# Patient Record
Sex: Female | Born: 1985 | Race: White | Hispanic: No | Marital: Married | Smoking: Never smoker
Health system: Southern US, Community
[De-identification: ages and names within clinical notes are randomized; demographics above are authoritative.]

## PROBLEM LIST (undated history)

## (undated) ENCOUNTER — Inpatient Hospital Stay (HOSPITAL_COMMUNITY): Payer: Self-pay

## (undated) DIAGNOSIS — O039 Complete or unspecified spontaneous abortion without complication: Secondary | ICD-10-CM

## (undated) DIAGNOSIS — O009 Unspecified ectopic pregnancy without intrauterine pregnancy: Secondary | ICD-10-CM

## (undated) DIAGNOSIS — G971 Other reaction to spinal and lumbar puncture: Secondary | ICD-10-CM

## (undated) DIAGNOSIS — I499 Cardiac arrhythmia, unspecified: Secondary | ICD-10-CM

## (undated) HISTORY — DX: Cardiac arrhythmia, unspecified: I49.9

## (undated) HISTORY — PX: NO PAST SURGERIES: SHX2092

---

## 2007-10-22 DIAGNOSIS — I499 Cardiac arrhythmia, unspecified: Secondary | ICD-10-CM

## 2007-10-22 HISTORY — DX: Cardiac arrhythmia, unspecified: I49.9

## 2011-07-16 ENCOUNTER — Inpatient Hospital Stay (HOSPITAL_COMMUNITY)
Admission: AD | Admit: 2011-07-16 | Discharge: 2011-07-16 | Disposition: A | Discharge status: Home / Self Care | Payer: Self-pay | Source: Ambulatory Visit | Attending: Obstetrics & Gynecology | Admitting: Obstetrics & Gynecology

## 2011-07-16 ENCOUNTER — Encounter (HOSPITAL_COMMUNITY): Payer: Self-pay | Admitting: *Deleted

## 2011-07-16 DIAGNOSIS — N949 Unspecified condition associated with female genital organs and menstrual cycle: Secondary | ICD-10-CM

## 2011-07-16 DIAGNOSIS — N938 Other specified abnormal uterine and vaginal bleeding: Secondary | ICD-10-CM | POA: Insufficient documentation

## 2011-07-16 LAB — URINALYSIS, ROUTINE W REFLEX MICROSCOPIC
Leukocytes, UA: NEGATIVE
Nitrite: NEGATIVE
Specific Gravity, Urine: >1.03 — ABNORMAL HIGH (ref 1.005–1.030)
Urobilinogen, UA: 0.2 mg/dL (ref 0.0–1.0)
pH: 6.5 (ref 5.0–8.0)

## 2011-07-16 LAB — POCT PREGNANCY, URINE: Preg Test, Ur: NEGATIVE

## 2011-07-16 LAB — URINE MICROSCOPIC-ADD ON

## 2011-07-16 LAB — WET PREP, GENITAL
Clue Cells Wet Prep HPF POC: NONE SEEN
Trich, Wet Prep: NONE SEEN

## 2011-07-16 NOTE — Progress Notes (Signed)
Pt presents to [facility] for brown discharge. Started yesterday. Has been having some mild cramping with that.

## 2011-07-16 NOTE — ED Provider Notes (Signed)
Attestation of Attending Supervision of Advanced Practitioner: Evaluation and management procedures were performed by the PA/NP/CNM/OB Fellow under my supervision/collaboration. Chart reviewed and agree with management and plan.  Albert Hersch A 07/16/2011 7:58 PM   

## 2011-07-16 NOTE — Progress Notes (Signed)
Pt reports she has had an IUD for 3 yrs and doesn't have periods but for the last 2 days she has had a brownish discharge and cramping x 2 days. Denies dysuria.

## 2011-07-16 NOTE — Progress Notes (Signed)
E. Rice, PA at bedside.  Assessment done and poc discussed with pt.   

## 2011-07-16 NOTE — Progress Notes (Signed)
SSE done per PA, wet prep and cultures collected. VE done.

## 2011-07-16 NOTE — ED Provider Notes (Signed)
Attestation of Attending Supervision of Advanced Practitioner: Evaluation and management procedures were performed by the PA/NP/CNM/OB Fellow under my supervision/collaboration. Chart reviewed and agree with management and plan.  Lanae Federer A 07/16/2011 9:06 PM

## 2011-07-16 NOTE — ED Provider Notes (Addendum)
History   Pt presents today c/o vag dc and spotting for the past 2 days. She states she had an IUD placed 3 years ago and has not had menses since that time. She also c/o mild lower abd cramping. She denies vag irritation, fever, or any other sx at this time. She reports her last episode of intercourse 3-4 days ago.  Chief Complaint  Patient presents with  . Abdominal Pain   HPI  OB History    Grav Para Term Preterm Abortions TAB SAB Ect Mult Living   2 2 1 1  0 0 0 0 0 2      Past Medical History  Diagnosis Date  . No pertinent past medical history     Past Surgical History  Procedure Date  . No past surgeries     No family history on file.  History  Substance Use Topics  . Smoking status: Never Smoker   . Smokeless tobacco: Not on file  . Alcohol Use: No    Allergies: No Known Allergies  No prescriptions prior to admission    Review of Systems  Constitutional: Negative for fever.  Cardiovascular: Negative for chest pain.  Gastrointestinal: Positive for abdominal pain. Negative for nausea, vomiting, diarrhea and constipation.  Genitourinary: Negative for dysuria, urgency, frequency and hematuria.  Neurological: Negative for dizziness and headaches.  Psychiatric/Behavioral: Negative for depression and suicidal ideas.   Physical Exam   Blood pressure 112/71, pulse 72, temperature 97 F (36.1 C), resp. rate 16, height 5\' 3"  (1.6 m), weight 122 lb (55.339 kg).  Physical Exam  Constitutional: She is oriented to person, place, and time. She appears well-developed and well-nourished. No distress.  HENT:  Head: Normocephalic and atraumatic.  Eyes: EOM are normal. Pupils are equal, round, and reactive to light.  GI: Soft. She exhibits no distension. There is no tenderness. There is no rebound and no guarding.  Genitourinary: There is bleeding around the vagina. Vaginal discharge found.       IUD strings intact. Uterus NL size and shape. No adnexal masses.    Neurological: She is alert and oriented to person, place, and time.  Skin: Skin is warm and dry. She is not diaphoretic.  Psychiatric: She has a normal mood and affect. Her behavior is normal. Judgment and thought content normal.    MAU Course  Procedures    Assessment and Plan  Care of pt turned over to [facility], CNM.  Clinton Gallant. Rice III, DrHSc, MPAS, PA-C  07/16/2011, 7:45 PM   Henrietta Hoover, PA 07/16/11 1957   Wet Preg: Neg for infection UA: Neg for infection UPT: neg  Assessment: DUB r/t IUD vs. Menstruation  Expectant Management FU with PCP: [facility] if spotting persists more than 10 days.  Sherryann Frese E. 07/16/2011 8:38 PM

## 2012-06-09 ENCOUNTER — Encounter (HOSPITAL_COMMUNITY): Payer: Self-pay

## 2012-06-09 ENCOUNTER — Inpatient Hospital Stay (HOSPITAL_COMMUNITY): Payer: BC Managed Care – PPO

## 2012-06-09 ENCOUNTER — Inpatient Hospital Stay (HOSPITAL_COMMUNITY)
Admission: AD | Admit: 2012-06-09 | Discharge: 2012-06-09 | Disposition: A | Discharge status: Home / Self Care | Payer: BC Managed Care – PPO | Source: Ambulatory Visit | Attending: Obstetrics & Gynecology | Admitting: Obstetrics & Gynecology

## 2012-06-09 DIAGNOSIS — O99891 Other specified diseases and conditions complicating pregnancy: Secondary | ICD-10-CM | POA: Insufficient documentation

## 2012-06-09 DIAGNOSIS — O0910 Supervision of pregnancy with history of ectopic or molar pregnancy, unspecified trimester: Secondary | ICD-10-CM

## 2012-06-09 LAB — CBC
Hemoglobin: 14.5 g/dL (ref 12.0–15.0)
MCH: 31 pg (ref 26.0–34.0)
MCHC: 34.9 g/dL (ref 30.0–36.0)
Platelets: 278 10*3/uL (ref 150–400)
RBC: 4.67 MIL/uL (ref 3.87–5.11)

## 2012-06-09 LAB — HCG, QUANTITATIVE, PREGNANCY: hCG, Beta Chain, Quant, S: 6173 m[IU]/mL — ABNORMAL HIGH (ref <5)

## 2012-06-09 LAB — ABO/RH: ABO/RH(D): O POS

## 2012-06-09 NOTE — MAU Note (Signed)
Patient states she had an ectopic with MTX in April in [facility]. Patient states she is pregnant again and has seen at [facility] and had Had a BHCG on 8-19 that was 5000 and an ultrasound(prior to [facility] results) with thickening of the lining of the uterus. Was instructed by her MD to return tomorrow for MTX. Patient is concerned and would like a second opinion. Patient denies any pain or bleeding.

## 2012-06-09 NOTE — MAU Provider Note (Signed)
Attestation of Attending Supervision of Advanced Practitioner (CNM/NP): Evaluation and management procedures were performed by the Advanced Practitioner under my supervision and collaboration.  I have reviewed the Advanced Practitioner's note and chart, and I agree with the management and plan.  Marchetta Navratil, MD, FACOG Attending Obstetrician & Gynecologist Faculty Practice, [facility] [place]  

## 2012-06-09 NOTE — MAU Provider Note (Signed)
History     CSN: [id number]  Arrival date and time: 06/09/12 1404   None     No chief complaint on file.  HPI 26 y.o. [id number] at [redacted]w[redacted]d. Seen by MD in [facility] yesterday with + UPT, pt states they did transvaginal u/s and could not visualize IUP, but states that her "endometrium was thickened". States today she received a call that her quant HCG was 5000 and was instructed to return for repeat u/s and possible MTX treatment for ectopic pregnancy. Has a history of ectopic in March of this year that was resolved with MTX. Pt denies pain or bleeding today, requesting evaluation for ectopic/second opinion.    Past Medical History  Diagnosis Date  . No pertinent past medical history     Past Surgical History  Procedure Date  . No past surgeries     No family history on file.  History  Substance Use Topics  . Smoking status: Never Smoker   . Smokeless tobacco: Not on file  . Alcohol Use: No    Allergies: No Known Allergies  No prescriptions prior to admission    Review of Systems  Constitutional: Negative.   Respiratory: Negative.   Cardiovascular: Negative.   Gastrointestinal: Negative for nausea, vomiting, abdominal pain, diarrhea and constipation.  Genitourinary: Negative for dysuria, urgency, frequency, hematuria and flank pain.       Negative for vaginal bleeding, vaginal discharge, dyspareunia  Musculoskeletal: Negative.   Neurological: Negative.   Psychiatric/Behavioral: Negative.    Physical Exam   Blood pressure 113/91, pulse 100, temperature 99.1 F (37.3 C), resp. rate 16, height 5' 2.5" (1.588 m), weight 125 lb (56.7 kg), last menstrual period 05/07/2012, SpO2 100.00%.  Physical Exam  Nursing note and vitals reviewed. Constitutional: She is oriented to person, place, and time. She appears well-developed and well-nourished. No distress.  Cardiovascular: Normal rate.   Respiratory: Effort normal.  Musculoskeletal: Normal range of motion.    Neurological: She is alert and oriented to person, place, and time.  Skin: Skin is warm and dry.  Psychiatric: She has a normal mood and affect.    MAU Course  Procedures  Results for orders placed during the hospital encounter of 06/09/12 (from the past 24 hour(s))  CBC     Status: Normal   Collection Time   06/09/12  2:40 PM      Component Value Range   WBC 8.2  4.0 - 10.5 K/uL   RBC 4.67  3.87 - 5.11 MIL/uL   Hemoglobin 14.5  12.0 - 15.0 g/dL   HCT 14.7  82.9 - 56.2 %   MCV 89.1  78.0 - 100.0 fL   MCH 31.0  26.0 - 34.0 pg   MCHC 34.9  30.0 - 36.0 g/dL   RDW 13.0  86.5 - 78.4 %   Platelets 278  150 - 400 K/uL  ABO/RH     Status: Normal (Preliminary result)   Collection Time   06/09/12  2:40 PM      Component Value Range   ABO/RH(D) O POS    HCG, QUANTITATIVE, PREGNANCY     Status: Abnormal   Collection Time   06/09/12  2:40 PM      Component Value Range   hCG, Beta Chain, Quant, S 6173 (*) <5 mIU/mL   U/S: IUGS, no yolk sac or fetal pole  Assessment and Plan  25 y.o. [id number] at [redacted]w[redacted]d F/U in 48 hours for repeat quant Precautions rev'd  Kayde Warehime 06/09/2012, 3:07 PM

## 2012-06-12 ENCOUNTER — Inpatient Hospital Stay (HOSPITAL_COMMUNITY)
Admission: AD | Admit: 2012-06-12 | Discharge: 2012-06-12 | Disposition: A | Discharge status: Home / Self Care | Payer: BC Managed Care – PPO | Source: Ambulatory Visit | Attending: Obstetrics & Gynecology | Admitting: Obstetrics & Gynecology

## 2012-06-12 DIAGNOSIS — Z3201 Encounter for pregnancy test, result positive: Secondary | ICD-10-CM

## 2012-06-12 DIAGNOSIS — O99891 Other specified diseases and conditions complicating pregnancy: Secondary | ICD-10-CM | POA: Insufficient documentation

## 2012-06-12 NOTE — MAU Provider Note (Signed)
History   Chief Complaint:  Follow-up   Jamie Wright is  26 y.o. [id number] Patient's last menstrual period was 05/07/2012.Jamie Wright Patient is here for follow up of quantitative HCG and ongoing surveillance of pregnancy status.   She is [redacted]w[redacted]d weeks gestation  by LMP.    Since her last visit, the patient is without new complaint.   The patient reports bleeding as  none now.    General ROS:  negative  Her previous Quantitative HCG values are: 8/20: 6173    Physical Exam   Blood pressure 106/67, pulse 71, temperature 97.5 F (36.4 C), temperature source Oral, resp. rate 18, last menstrual period 05/07/2012.  Focused Gynecological Exam: examination not indicated  Labs: Recent Results (from the past 24 hour(s))  HCG, QUANTITATIVE, PREGNANCY   Collection Time   06/12/12  8:46 AM      Component Value Range   hCG, Beta Chain, Quant, S 14616 (*) <5 mIU/mL    Ultrasound Studies:    Assessment: [redacted]w[redacted]d weeks by LMP gestation with appropriately rising quants   Plan: Discharge home FU in 1 week for repeat US  Courtland Reas E. 06/12/2012, 11:49 AM

## 2012-06-12 NOTE — MAU Provider Note (Signed)
Attestation of Attending Supervision of Advanced Practitioner (CNM/NP): Evaluation and management procedures were performed by the Advanced Practitioner under my supervision and collaboration.  I have reviewed the Advanced Practitioner's note and chart, and I agree with the management and plan.  HARRAWAY-SMITH, Sherral Dirocco 3:36 PM     

## 2012-06-12 NOTE — MAU Note (Signed)
Here for blood work.  States is feeling good, no pain or bleeding.

## 2012-06-17 ENCOUNTER — Encounter (HOSPITAL_COMMUNITY): Payer: Self-pay | Admitting: *Deleted

## 2012-06-17 ENCOUNTER — Inpatient Hospital Stay (HOSPITAL_COMMUNITY)
Admission: AD | Admit: 2012-06-17 | Discharge: 2012-06-17 | Disposition: A | Discharge status: Home / Self Care | Payer: BC Managed Care – PPO | Source: Ambulatory Visit | Attending: Obstetrics & Gynecology | Admitting: Obstetrics & Gynecology

## 2012-06-17 ENCOUNTER — Ambulatory Visit (HOSPITAL_COMMUNITY)
Admit: 2012-06-17 | Discharge: 2012-06-17 | Disposition: A | Discharge status: Home / Self Care | Payer: BC Managed Care – PPO | Attending: Physician Assistant | Admitting: Physician Assistant

## 2012-06-17 DIAGNOSIS — R109 Unspecified abdominal pain: Secondary | ICD-10-CM

## 2012-06-17 DIAGNOSIS — Z8751 Personal history of pre-term labor: Secondary | ICD-10-CM | POA: Insufficient documentation

## 2012-06-17 DIAGNOSIS — O210 Mild hyperemesis gravidarum: Secondary | ICD-10-CM | POA: Insufficient documentation

## 2012-06-17 DIAGNOSIS — Z3490 Encounter for supervision of normal pregnancy, unspecified, unspecified trimester: Secondary | ICD-10-CM

## 2012-06-17 DIAGNOSIS — Z3201 Encounter for pregnancy test, result positive: Secondary | ICD-10-CM

## 2012-06-17 DIAGNOSIS — O26899 Other specified pregnancy related conditions, unspecified trimester: Secondary | ICD-10-CM

## 2012-06-17 DIAGNOSIS — O3680X0 Pregnancy with inconclusive fetal viability, not applicable or unspecified: Secondary | ICD-10-CM | POA: Insufficient documentation

## 2012-06-17 HISTORY — DX: Unspecified ectopic pregnancy without intrauterine pregnancy: O00.90

## 2012-06-17 MED ORDER — ONDANSETRON 4 MG PO TBDP: 4.0000 mg | ORAL_TABLET | Freq: Four times a day (QID) | ORAL | Status: AC | PRN

## 2012-06-17 MED ORDER — PRENATAL PLUS 27-1 MG PO TABS: 1.0000 | ORAL_TABLET | Freq: Every day | ORAL | Status: DC

## 2012-06-17 NOTE — MAU Provider Note (Signed)
Chief Complaint: No chief complaint on file.   None    SUBJECTIVE HPI: Jamie Wright is a 26 y.o. [id number] at [redacted]w[redacted]d by LMP who presents after scheduled F/U US. Denies abd pain or vaginal bleeding. No concerns or medical problems.   Past Medical History  Diagnosis Date  . UTI (urinary tract infection)    OB History    Grav Para Term Preterm Abortions TAB SAB Ect Mult Living   4 2 1 1 1  0 0 1 0 2     # Outc Date GA Lbr Len/2nd Wgt Sex Del Anes PTL Lv   1 TRM            2 PRE            3 ECT            4 CUR              Past Surgical History  Procedure Date  . No past surgeries    History   Social History  . Marital Status: Married    Spouse Name: N/A    Number of Children: N/A  . Years of Education: N/A   Occupational History  . Not on file.   Social History Main Topics  . Smoking status: Former Games developer  . Smokeless tobacco: Not on file  . Alcohol Use: No  . Drug Use: No  . Sexually Active: Yes   Other Topics Concern  . Not on file   Social History Narrative  . No narrative on file   No current facility-administered medications on file prior to encounter.   No current outpatient prescriptions on file prior to encounter.   No Known Allergies  ROS: Pertinent items in HPI  OBJECTIVE Last menstrual period 05/07/2012. GENERAL: Well-developed, well-nourished female in no acute distress.  HEENT: Normocephalic HEART: normal rate RESP: normal effort ABDOMEN: Soft, non-tender EXTREMITIES: Nontender, no edema NEURO: Alert and oriented LAB RESULTS No results found for this or any previous visit (from the past 24 hour(s)).  IMAGING Viable IUP with FHR 101, GA by sac size [redacted]w[redacted]d; adnexae normal  ASSESSMENT 1. Abdominal pain complicating pregnancy   2. Viable Pregnancy   3. Pregnancy related nausea, antepartum     PLAN Discharge home Medication List  As of 06/17/2012  5:05 PM   TAKE these medications         ondansetron 4 MG disintegrating tablet   Commonly known as: ZOFRAN-ODT   Take 1 tablet (4 mg total) by mouth every 6 (six) hours as needed for nausea.      prenatal vitamin w/FE, FA 27-1 MG Tabs   Take 1 tablet by mouth daily.           Pregnancy precautions and list of providers given  Danae Orleans, CNM 06/17/2012  4:53 PM

## 2012-06-17 NOTE — MAU Note (Signed)
Here for viability [place].  No complaints of pain or bleeding.

## 2012-06-18 NOTE — MAU Provider Note (Signed)
Attestation of Attending Supervision of Advanced Practitioner (CNM/NP): Evaluation and management procedures were performed by the Advanced Practitioner under my supervision and collaboration.  I have reviewed the Advanced Practitioner's note and chart, and I agree with the management and plan.  Ja Ohman, MD, FACOG Attending Obstetrician & Gynecologist Faculty Practice, [facility]  

## 2012-07-02 ENCOUNTER — Telehealth: Payer: Self-pay | Admitting: Obstetrics and Gynecology

## 2012-07-02 ENCOUNTER — Inpatient Hospital Stay (HOSPITAL_COMMUNITY): Payer: BC Managed Care – PPO

## 2012-07-02 ENCOUNTER — Encounter (HOSPITAL_COMMUNITY): Payer: Self-pay | Admitting: *Deleted

## 2012-07-02 ENCOUNTER — Inpatient Hospital Stay (HOSPITAL_COMMUNITY)
Admission: AD | Admit: 2012-07-02 | Discharge: 2012-07-02 | Disposition: A | Discharge status: Home / Self Care | Payer: BC Managed Care – PPO | Source: Ambulatory Visit | Attending: Obstetrics and Gynecology | Admitting: Obstetrics and Gynecology

## 2012-07-02 DIAGNOSIS — O210 Mild hyperemesis gravidarum: Secondary | ICD-10-CM | POA: Insufficient documentation

## 2012-07-02 HISTORY — DX: Other reaction to spinal and lumbar puncture: G97.1

## 2012-07-02 LAB — URINALYSIS, ROUTINE W REFLEX MICROSCOPIC
Glucose, UA: NEGATIVE mg/dL
Hgb urine dipstick: NEGATIVE
Ketones, ur: NEGATIVE mg/dL
Protein, ur: NEGATIVE mg/dL
Urobilinogen, UA: 0.2 mg/dL (ref 0.0–1.0)

## 2012-07-02 LAB — COMPREHENSIVE METABOLIC PANEL
BUN: 8 mg/dL (ref 6–23)
CO2: 23 mEq/L (ref 19–32)
Calcium: 9.3 mg/dL (ref 8.4–10.5)
Chloride: 103 mEq/L (ref 96–112)
Creatinine, Ser: 0.69 mg/dL (ref 0.50–1.10)
GFR calc Af Amer: >90 mL/min (ref >90)
GFR calc non Af Amer: >90 mL/min (ref >90)
Glucose, Bld: 83 mg/dL (ref 70–99)
Total Bilirubin: 0.3 mg/dL (ref 0.3–1.2)

## 2012-07-02 LAB — CBC
HCT: 36.8 % (ref 36.0–46.0)
MCH: 31.1 pg (ref 26.0–34.0)
MCV: 90.2 fL (ref 78.0–100.0)
RBC: 4.08 MIL/uL (ref 3.87–5.11)
WBC: 6.8 10*3/uL (ref 4.0–10.5)

## 2012-07-02 MED ORDER — ONDANSETRON 4 MG PO TBDP: 4.0000 mg | ORAL_TABLET | Freq: Three times a day (TID) | ORAL | Status: AC | PRN

## 2012-07-02 MED ORDER — FAMOTIDINE IN NACL 20-0.9 MG/50ML-% IV SOLN
20.0000 mg | Freq: Once | INTRAVENOUS | Status: AC
  Administered 2012-07-02: 20 mg via INTRAVENOUS
  Filled 2012-07-02: qty 50

## 2012-07-02 MED ORDER — LACTATED RINGERS IV BOLUS (SEPSIS)
500.0000 mL | Freq: Once | INTRAVENOUS | Status: AC
  Administered 2012-07-02: 14:00:00 via INTRAVENOUS

## 2012-07-02 MED ORDER — ONDANSETRON HCL 4 MG/2ML IJ SOLN
4.0000 mg | Freq: Once | INTRAMUSCULAR | Status: AC
  Administered 2012-07-02: 4 mg via INTRAVENOUS
  Filled 2012-07-02: qty 2

## 2012-07-02 NOTE — Telephone Encounter (Signed)
JACKIE/REQUEST CALL

## 2012-07-02 NOTE — MAU Note (Signed)
Patient states she has had nausea and vomiting all week. Unable to keep almost anything down. Had a fall 2 days ago and having right side pain. No bleeding.

## 2012-07-02 NOTE — Telephone Encounter (Signed)
Spoke to pt who was initially c/o of just nausea and vomitting/ unable to keep anything down. She then went on to tell me that she had fallen down a step or two carrying laundry down the steps on Tues. This week. No bleeding, but pt c/o a lot of back pain and pelvic pain. Per Paulino Door, pt should present to MAU this am. Pt is agreeable and will  Go now. Melody Comas A

## 2012-07-02 NOTE — MAU Provider Note (Signed)
History    This patient had called the office today and stated that she has been nauseated for past few weeks and has been vomiting. GA [redacted] weeks from LMP and early USS at [facility]. Also had hx of all down 15 steps in past 4 days. Needs evaluation s/p fall. Presents with no headache or injury. The patient has been attending [facility] and transferred to [facility]. She has her OB interview tomorrow. Hx of Ectopic Pregnancy  April 2013 resolved with Methotrexate.  CSN: [id number]  Arrival date and time: 07/02/12 1105   None     Chief Complaint  Patient presents with  . Emesis During Pregnancy   HPI  OB History    Grav Para Term Preterm Abortions TAB SAB Ect Mult Living   4 2 1 1 1  0 0 1 0 2      Past Medical History  Diagnosis Date  . UTI (urinary tract infection)   . Preterm labor   . Ectopic pregnancy   . Spinal headache     ? bad migraines following delivery    Past Surgical History  Procedure Date  . No past surgeries     Family History  Problem Relation Age of Onset  . Other Neg Hx   . Hearing loss Neg Hx     History  Substance Use Topics  . Smoking status: Never Smoker   . Smokeless tobacco: Never Used  . Alcohol Use: No    Allergies: No Known Allergies  Prescriptions prior to admission  Medication Sig Dispense Refill  . acetaminophen (TYLENOL) 500 MG tablet Take 500 mg by mouth every 6 (six) hours as needed. headache      . Prenatal Vit-Fe Fumarate-FA (PRENATAL MULTIVITAMIN) TABS Take 1 tablet by mouth daily.        Review of Systems  Constitutional: Negative.   HENT: Negative.   Eyes: Negative.   Respiratory: Negative.   Cardiovascular: Negative.   Gastrointestinal: Positive for heartburn, nausea and vomiting.       Patient has N&V for past few weeks and suffering from  Acid reflux since N&V commenced  Genitourinary: Negative.   Musculoskeletal: Negative.   Skin: Negative.   Neurological: Negative.   Endo/Heme/Allergies:  Negative.   Psychiatric/Behavioral: Negative.    Physical Exam   Blood pressure 109/72, pulse 91, temperature 98.7 F (37.1 C), temperature source Oral, resp. rate 16, height 5\' 3"  (1.6 m), weight 126 lb 3.2 oz (57.244 kg), last menstrual period 05/07/2012, SpO2 100.00%.  Physical Exam  Constitutional: She is oriented to person, place, and time. She appears well-developed and well-nourished.  HENT:  Head: Normocephalic.  Eyes: Conjunctivae normal and EOM are normal. Pupils are equal, round, and reactive to light.  Neck: Normal range of motion. Neck supple.  Cardiovascular: Normal rate, regular rhythm and normal heart sounds.   Respiratory: Effort normal and breath sounds normal.  GI: Soft. Bowel sounds are normal.  Genitourinary: Vagina normal and uterus normal.  Musculoskeletal: Normal range of motion.  Neurological: She is alert and oriented to person, place, and time. She has normal reflexes.  Skin: Skin is warm and dry.  Psychiatric: She has a normal mood and affect.    MAU Course  Procedures  IV Hydration LR bolus, IV Zofran 4mg s, IV Pepcid 20 mgs, OB USS >14 wks.  Assessment and Plan  Patient has exacerbation of N&V associated with pregnancy. Now well hydrated and has had a negative OB USS Prescription for Zofran 4mg s  ODT for Nausea F/u [facility] in am for NOB Interview.  Earl Gala, CNM 07/02/2012, 1:22 PM

## 2012-07-02 NOTE — MAU Note (Signed)
Ongoing nausea and vomiting. First appt in office is tomorrow.

## 2012-07-20 ENCOUNTER — Ambulatory Visit (INDEPENDENT_AMBULATORY_CARE_PROVIDER_SITE_OTHER): Payer: Medicaid Other | Admitting: Obstetrics and Gynecology

## 2012-07-20 DIAGNOSIS — Z331 Pregnant state, incidental: Secondary | ICD-10-CM

## 2012-07-20 LAB — POCT URINALYSIS DIPSTICK
Glucose, UA: NEGATIVE
Nitrite, UA: NEGATIVE
Spec Grav, UA: 1.01
Urobilinogen, UA: NEGATIVE

## 2012-07-20 NOTE — Progress Notes (Signed)
NOB interview completed.  Pt states had an US done @ [facility], but no lab work other than a QHCG d/t hx of ectopic pregnancy 01/2012.  ROI form completed for records and records from previous pregnancies.  NOB work up scheduled on Wednesday 07/29/12 @ 1100 w/ MK.

## 2012-07-21 LAB — PRENATAL PANEL VII
Eosinophils Absolute: 0.1 10*3/uL (ref 0.0–0.7)
HIV: NONREACTIVE
Hepatitis B Surface Ag: NEGATIVE
Lymphocytes Relative: 18 % (ref 12–46)
Lymphs Abs: 1.2 10*3/uL (ref 0.7–4.0)
MCH: 31.6 pg (ref 26.0–34.0)
Neutro Abs: 4.6 10*3/uL (ref 1.7–7.7)
Neutrophils Relative %: 73 % (ref 43–77)
Platelets: 224 10*3/uL (ref 150–400)
RBC: 4.18 MIL/uL (ref 3.87–5.11)
Rubella: 24 IU/mL — ABNORMAL HIGH
WBC: 6.3 10*3/uL (ref 4.0–10.5)

## 2012-07-22 LAB — CULTURE, OB URINE
Colony Count: NO GROWTH
Organism ID, Bacteria: NO GROWTH

## 2012-07-23 ENCOUNTER — Telehealth: Payer: Self-pay | Admitting: Obstetrics and Gynecology

## 2012-07-23 NOTE — Telephone Encounter (Signed)
TC from pt. States last PM had slight "twinges" above pubic bone. Today having pain with walking and when lifted leg to put on shoe. Also feel like pressure. No bleeding. Has been drinking only 2 bottles of water. No UTI sx. Informed may be round ligament pain. Suggested increase water, may take Tylenol, warm bath. To call if no improvement or any concerns. Pt verbalizes comprehension.

## 2012-07-27 ENCOUNTER — Encounter: Payer: Self-pay | Admitting: Obstetrics and Gynecology

## 2012-07-27 DIAGNOSIS — Z331 Pregnant state, incidental: Secondary | ICD-10-CM | POA: Insufficient documentation

## 2012-07-27 DIAGNOSIS — Z8759 Personal history of other complications of pregnancy, childbirth and the puerperium: Secondary | ICD-10-CM | POA: Insufficient documentation

## 2012-07-27 DIAGNOSIS — O09299 Supervision of pregnancy with other poor reproductive or obstetric history, unspecified trimester: Secondary | ICD-10-CM | POA: Insufficient documentation

## 2012-07-29 ENCOUNTER — Encounter: Payer: Self-pay | Admitting: Obstetrics and Gynecology

## 2012-07-29 ENCOUNTER — Ambulatory Visit (INDEPENDENT_AMBULATORY_CARE_PROVIDER_SITE_OTHER): Payer: Medicaid Other | Admitting: Obstetrics and Gynecology

## 2012-07-29 VITALS — BP 98/62 | Wt 126.0 lb

## 2012-07-29 DIAGNOSIS — Z8759 Personal history of other complications of pregnancy, childbirth and the puerperium: Secondary | ICD-10-CM

## 2012-07-29 DIAGNOSIS — Z8742 Personal history of other diseases of the female genital tract: Secondary | ICD-10-CM

## 2012-07-29 DIAGNOSIS — O09299 Supervision of pregnancy with other poor reproductive or obstetric history, unspecified trimester: Secondary | ICD-10-CM

## 2012-07-29 DIAGNOSIS — Z331 Pregnant state, incidental: Secondary | ICD-10-CM

## 2012-07-29 NOTE — Progress Notes (Signed)
Patient ID: Jamie Wright, female   DOB: 16-Oct-1986, 26 y.o.   MRN: [id number] [redacted]w[redacted]d Jamie Wright is a 26 y.o. female presenting for new ob visit.  Planned pg. Certain of LMP, 8/28 [redacted]W[redacted]D [place] agrees with LMP. Taking PNV, hospitalized for dehydration, N,V resolved. @MED  @IPILAPH @ OB History    Grav Para Term Preterm Abortions TAB SAB Ect Mult Living   4 2 1 1 1  0 0 1 0 2     Past Medical History  Diagnosis Date  . UTI (urinary tract infection)   . Ectopic pregnancy   . Spinal headache     ? bad migraines following delivery  . Irregular heartbeat 2009    Hx of Echo done  . Preterm labor 2008    Late 2nd or Early 3rd trimester PTL, injection given to stop contractions, was d/c home w/ meds  . Preterm labor 2009    Preterm birth @ 32 wks; Abrupted placenta  . Preterm labor 2009    Prior to delivery of 2nd child;had contractions frequently  . Infection     BV;not frequent  . Infection     UTI;not frequent   Past Surgical History  Procedure Date  . No past surgeries    Family History: family history includes Cancer in her maternal aunt; Cerebral palsy in her sister; and Other in her maternal grandfather and mother.  There is no history of Hearing loss.  Mother with Charcot Antonieta Loveless Social History:  reports that she has never smoked. She has never used smokeless tobacco. She reports that she does not drink alcohol or use illicit drugs.  @ROS @    Blood pressure 98/62, weight 126 lb (57.153 kg), last menstrual period 05/07/2012. Physical exam: Calm, no distress, HEENT wnl lungs clear bilaterally, breasts bilaterally no masses, dimpling, or drainage, AP RRR, abd soft, gravid, nt, bowel sounds active, abdomen nontender, Fundal height. 12 FHTS 160 Normal hair distrubition mons pubis,  EGBUS WNL, cervix LTC no CMT, with adequate pelvis,  DTR + 2 no clonus No edema to lower extremities  Prenatal labs: ABO, Rh: O/POS/-- (09/30 1533) Antibody: NEG (09/30 1533) Rubella:   Immune RPR: NON REAC (09/30 1533)  HBsAg: NEGATIVE (09/30 1533)  HIV: NON REACTIVE (09/30 1533)  GBS:   na  Assessment/Plan: [redacted]w[redacted]d GC/CHL neg/neg 07/15/2012 WET PREP neg PAP records pending Genetic testing declined. Interested in 17P for hx of preterm labor and delivery. Collaboration with Dr. Vanita Panda, [facility] 07/29/2012, 12:41 PM Lavera Guise, CNM

## 2012-07-29 NOTE — Progress Notes (Signed)
[redacted]w[redacted]d pt has no concerns.  New Ob Work Up

## 2012-07-30 ENCOUNTER — Other Ambulatory Visit: Payer: Self-pay | Admitting: Obstetrics and Gynecology

## 2012-07-30 ENCOUNTER — Telehealth: Payer: Self-pay | Admitting: Obstetrics and Gynecology

## 2012-07-30 MED ORDER — BUTALBITAL-APAP-CAFFEINE 50-325-40 MG PO TABS: 1.0000 | ORAL_TABLET | Freq: Two times a day (BID) | ORAL | Status: DC | PRN

## 2012-07-31 NOTE — Telephone Encounter (Signed)
Late entry (07/30/12): Tc to pt regarding msg.  Pt states went to bed last with a migraine and had awakened this am with a migraine.  Has tried Tylenol without any relief.  In the past had taken Ibuprofen for the migraine but has not had one in so long.  Per VL, can call in Fioricet to help with migraine.  Fioricet e-prescribed to pt's pharmacy, pt voices agreement, will call with any concerns.

## 2012-08-10 ENCOUNTER — Telehealth: Payer: Self-pay | Admitting: Obstetrics and Gynecology

## 2012-08-10 NOTE — Telephone Encounter (Signed)
Spoke with pt rgd msg pt wants some additional info on 17p advised pt will mail info if have any additional question can discuss at next visit pt voice understanding

## 2012-08-12 ENCOUNTER — Telehealth: Payer: Self-pay | Admitting: Obstetrics and Gynecology

## 2012-08-12 NOTE — Telephone Encounter (Signed)
Returned pt call, pt c/o cramping since last night. Pt is unsure if it may be due to stress, states that her husband said he is leaving her. Pt has not had any spotting or bleeding. Pt also has not tried to take tylenol for relief. Advised pt to try taking tylenol every 4-6 hours for the next 24 hours to see if it provides any relief. Also advised pt to drink at least 8 glasses of water per day and to call the office immediately if she has any bleeding or spotting. Pt voiced understanding.

## 2012-08-19 ENCOUNTER — Ambulatory Visit (INDEPENDENT_AMBULATORY_CARE_PROVIDER_SITE_OTHER): Payer: Medicaid Other

## 2012-08-19 ENCOUNTER — Ambulatory Visit (INDEPENDENT_AMBULATORY_CARE_PROVIDER_SITE_OTHER): Payer: Medicaid Other | Admitting: Obstetrics and Gynecology

## 2012-08-19 ENCOUNTER — Telehealth: Payer: Self-pay | Admitting: Obstetrics and Gynecology

## 2012-08-19 VITALS — BP 100/58 | Wt 125.0 lb

## 2012-08-19 DIAGNOSIS — Z331 Pregnant state, incidental: Secondary | ICD-10-CM

## 2012-08-19 DIAGNOSIS — R102 Pelvic and perineal pain: Secondary | ICD-10-CM

## 2012-08-19 DIAGNOSIS — N949 Unspecified condition associated with female genital organs and menstrual cycle: Secondary | ICD-10-CM

## 2012-08-19 LAB — POCT URINALYSIS DIPSTICK: Urobilinogen, UA: NEGATIVE

## 2012-08-19 LAB — US OB LIMITED

## 2012-08-19 MED ORDER — IBUPROFEN 600 MG PO TABS: 600.0000 mg | ORAL_TABLET | Freq: Four times a day (QID) | ORAL | Status: DC

## 2012-08-19 NOTE — Telephone Encounter (Signed)
TC to pt. States has been having abd cramping around umbilical area or above x 3 days.  Normal BM.  No UTI sx. Also having vag pressure when standing.  No relief with Tylenol. Some relief after resting 2-3 hours. Concerned due to hx of PTD.   Sched with VL today.

## 2012-08-19 NOTE — Progress Notes (Signed)
Pt stated been feeling cramping/abdominal pain . Pt stated she been drinking about 5 glasses of water a day. Advised pt to drink atleast  8-10 glasses of water a day .

## 2012-08-19 NOTE — Progress Notes (Signed)
Here for cramping--concerned due to PTD and abruption at 32 weeks with last delivery. No bleeding or d/c.  No dysuria Plans 17P initiation at 16 weeks. Cervix closed, long, firm by exam. Cultures today (not done at NOB) Wet prep negative. Recommended Ibuprophen 600 mg q 6 hours x 1-2 days, then prn. [place] today for cervical status: SIUP, Cervix 3.72, anterior placenta, normal fluid, normal ovaries. RTO in 2 weeks for [place] for anatomy. Declines genetic testing

## 2012-08-21 LAB — GC/CHLAMYDIA PROBE AMP: GC Probe RNA: NEGATIVE

## 2012-08-26 ENCOUNTER — Ambulatory Visit (INDEPENDENT_AMBULATORY_CARE_PROVIDER_SITE_OTHER): Payer: Medicaid Other | Admitting: Obstetrics and Gynecology

## 2012-08-26 ENCOUNTER — Encounter: Payer: Self-pay | Admitting: Obstetrics and Gynecology

## 2012-08-26 VITALS — BP 120/80 | Wt 126.0 lb

## 2012-08-26 DIAGNOSIS — Z23 Encounter for immunization: Secondary | ICD-10-CM

## 2012-08-26 DIAGNOSIS — O09219 Supervision of pregnancy with history of pre-term labor, unspecified trimester: Secondary | ICD-10-CM

## 2012-08-26 DIAGNOSIS — Z331 Pregnant state, incidental: Secondary | ICD-10-CM

## 2012-08-26 DIAGNOSIS — O09299 Supervision of pregnancy with other poor reproductive or obstetric history, unspecified trimester: Secondary | ICD-10-CM

## 2012-08-26 MED ORDER — HYDROXYPROGESTERONE CAPROATE 250 MG/ML IM OIL
250.0000 mg | TOPICAL_OIL | Freq: Once | INTRAMUSCULAR | Status: AC
  Administered 2012-08-26: 250 mg via INTRAMUSCULAR

## 2012-08-26 NOTE — Progress Notes (Signed)
[redacted]w[redacted]d   Pt has no complaints Pt has a cold and declines flu vaccine but will get next visit

## 2012-08-26 NOTE — Progress Notes (Signed)
[redacted]w[redacted]d Quad screen today Initiate 17 OHP today and weekly Follow-up 3 weeks ROB and anatomy scan

## 2012-08-27 LAB — AFP, QUAD SCREEN
INH: 340.3 pg/mL
Interpretation-AFP: NEGATIVE
MoM for hCG: 1.38
Osb Risk: <1:27300 {titer}
Tri 18 Scr Risk Est: NEGATIVE
uE3 Mom: 1.49
uE3 Value: 0.7 ng/mL

## 2012-09-02 ENCOUNTER — Other Ambulatory Visit: Payer: BC Managed Care – PPO

## 2012-09-02 DIAGNOSIS — O09219 Supervision of pregnancy with history of pre-term labor, unspecified trimester: Secondary | ICD-10-CM

## 2012-09-02 MED ORDER — HYDROXYPROGESTERONE CAPROATE 250 MG/ML IM OIL
250.0000 mg | TOPICAL_OIL | Freq: Once | INTRAMUSCULAR | Status: AC
  Administered 2012-09-02: 250 mg via INTRAMUSCULAR

## 2012-09-09 ENCOUNTER — Other Ambulatory Visit: Payer: BC Managed Care – PPO

## 2012-09-09 DIAGNOSIS — O09219 Supervision of pregnancy with history of pre-term labor, unspecified trimester: Secondary | ICD-10-CM

## 2012-09-09 MED ORDER — HYDROXYPROGESTERONE CAPROATE 250 MG/ML IM OIL
250.0000 mg | TOPICAL_OIL | Freq: Once | INTRAMUSCULAR | Status: AC
  Administered 2012-09-09: 250 mg via INTRAMUSCULAR

## 2012-09-16 ENCOUNTER — Ambulatory Visit (INDEPENDENT_AMBULATORY_CARE_PROVIDER_SITE_OTHER): Payer: BC Managed Care – PPO | Admitting: Obstetrics and Gynecology

## 2012-09-16 ENCOUNTER — Ambulatory Visit: Payer: BC Managed Care – PPO

## 2012-09-16 VITALS — BP 100/60 | Wt 128.0 lb

## 2012-09-16 DIAGNOSIS — Z331 Pregnant state, incidental: Secondary | ICD-10-CM

## 2012-09-16 DIAGNOSIS — O09219 Supervision of pregnancy with history of pre-term labor, unspecified trimester: Secondary | ICD-10-CM

## 2012-09-16 LAB — US OB COMP + 14 WK

## 2012-09-16 MED ORDER — MEDROXYPROGESTERONE ACETATE 150 MG/ML IM SUSP
150.0000 mg | Freq: Once | INTRAMUSCULAR | Status: AC
  Administered 2012-09-16: 150 mg via INTRAMUSCULAR

## 2012-09-16 NOTE — Progress Notes (Signed)
[redacted]w[redacted]d Ultrasound: normal except limited spine evaluation due to fetal presentation and hypocoiled cord

## 2012-09-16 NOTE — Progress Notes (Signed)
Ultrasound shows:  SIUP  S=D     [place] EDD: 02/11/13          EFW: 10 OZ 55.8%           AFI: n/a           Cervical length: 3.42 cm           Placenta localization: anterior           Fetal presentation: breech                    Anatomy survey is normal           Gender : female Comments: Placental edge is 3.6 cm from Internal OS-normal. Fluid Normal. Vertical Pocket = 3.4 cm. No anomalies seen. Suggest f/u growth at 28 weeks. Normal ovaries and adnexa

## 2012-09-16 NOTE — Progress Notes (Signed)
[redacted]w[redacted]d  Pt has no complaints

## 2012-09-20 ENCOUNTER — Telehealth (HOSPITAL_COMMUNITY): Payer: Self-pay | Admitting: Obstetrics and Gynecology

## 2012-09-20 NOTE — Telephone Encounter (Signed)
Pt calls with c/o of scant amt of bright red spotting only with wiping this AM.  Noted only 1 time and has not noted since.  She also reports having light pink spotting with wiping only 2 days ago as well.  Had intercourse 2 days ago and spotting began after this.  Denies any cramping or leakage of fluid.  Active fetus.  Receives 17-P weekly due to hx of PTD in past.  Was seen in office on Wed 11/27 and results of that ultrasound without evidence of of previa or [facility] and cervical length was 3.6cm.  D/W pt rest, push po fluids and no further intercourse until at least 2 weeks after last episode of spotting.

## 2012-09-21 ENCOUNTER — Telehealth: Payer: Self-pay | Admitting: Obstetrics and Gynecology

## 2012-09-21 ENCOUNTER — Encounter: Payer: Self-pay | Admitting: Obstetrics and Gynecology

## 2012-09-21 ENCOUNTER — Ambulatory Visit (INDEPENDENT_AMBULATORY_CARE_PROVIDER_SITE_OTHER): Payer: BC Managed Care – PPO | Admitting: Obstetrics and Gynecology

## 2012-09-21 VITALS — BP 90/60 | Wt 129.0 lb

## 2012-09-21 DIAGNOSIS — Z331 Pregnant state, incidental: Secondary | ICD-10-CM

## 2012-09-21 DIAGNOSIS — R102 Pelvic and perineal pain: Secondary | ICD-10-CM

## 2012-09-21 LAB — POCT URINALYSIS DIPSTICK
Bilirubin, UA: NEGATIVE
Glucose, UA: NEGATIVE
Nitrite, UA: NEGATIVE
pH, UA: 7

## 2012-09-21 NOTE — Progress Notes (Signed)
[redacted]w[redacted]d Pt c/o spotting with wiping and cramping Physical Examination: Pelvic - normal external genitalia, vulva, vagina, cervix, uterus and adnexa, cx long and closed on exam.  No blood in the vault [place] tomorrow for cx length and visit Motrin and tylenol prn Get 17 p tomorrow as well

## 2012-09-21 NOTE — Telephone Encounter (Signed)
Tc to pt regarding msg.  Pt states spoke w/ someone on Saturday, had spotting on/off that was light pink in color, was told to stay off of feet.  On Sunday pt had spotting 3 times and cramping on/off all night.  Also states had no fetal movement at all yesterday, has felt baby move since 16 wks.  Today pt is having cramping.  It hurts her to walk or sit. Has hx of preterm labor @ 32 wks.  Pt scheduled an appt for today @ 1100 w/ ND for eval.

## 2012-09-21 NOTE — Patient Instructions (Signed)
Abdominal Pain During Pregnancy  Abdominal discomfort is common in pregnancy. Most of the time, it does not cause harm. There are many causes of abdominal pain. Some causes are more serious than others. Some of the causes of abdominal pain in pregnancy are easily diagnosed. Occasionally, the diagnosis takes time to understand. Other times, the cause is not determined. Abdominal pain can be a sign that something is very wrong with the pregnancy, or the pain may have nothing to do with the pregnancy at all. For this reason, always tell your caregiver if you have any abdominal discomfort.  CAUSES  Common and harmless causes of abdominal pain include:   Constipation.   Excess gas and bloating.   Round ligament pain. This is pain that is felt in the folds of the groin.   The position the baby or placenta is in.   Baby kicks.   Braxton-Hicks contractions. These are mild contractions that do not cause cervical dilation.  Serious causes of abdominal pain include:   Ectopic pregnancy. This happens when a fertilized egg implants outside of the uterus.   Miscarriage.   Preterm labor. This is when labor starts at less than 37 weeks of pregnancy.   Placental abruption. This is when the placenta partially or completely separates from the uterus.   Preeclampsia. This is often associated with high blood pressure and has been referred to as "toxemia in pregnancy."   Uterine or amniotic fluid infections.  Causes unrelated to pregnancy include:   Urinary tract infection.   Gallbladder stones or inflammation.   Hepatitis or other liver illness.   Intestinal problems, stomach flu, food poisoning, or ulcer.   Appendicitis.   Kidney (renal) stones.   Kidney infection (pylonephritis).  HOME CARE INSTRUCTIONS   For mild pain:   Do not have sexual intercourse or put anything in your vagina until your symptoms go away completely.   Get plenty of rest until your pain improves. If your pain does not improve in 1 hour, call  your caregiver.   Drink clear fluids if you feel nauseous. Avoid solid food as long as you are uncomfortable or nauseous.   Only take medicine as directed by your caregiver.   Keep all follow-up appointments with your caregiver.  SEEK IMMEDIATE MEDICAL CARE IF:   You are bleeding, leaking fluid, or passing tissue from the vagina.   You have increasing pain or cramping.   You have persistent vomiting.   You have painful or bloody urination.   You have a fever.   You notice a decrease in your baby's movements.   You have extreme weakness or feel faint.   You have shortness of breath, with or without abdominal pain.   You develop a severe headache with abdominal pain.   You have abnormal vaginal discharge with abdominal pain.   You have persistent diarrhea.   You have abdominal pain that continues even after rest, or gets worse.  MAKE SURE YOU:    Understand these instructions.   Will watch your condition.   Will get help right away if you are not doing well or get worse.  Document Released: 10/07/2005 Document Revised: 12/30/2011 Document Reviewed: 05/03/2011  [employer] Patient Information 2013 [employer].

## 2012-09-21 NOTE — Progress Notes (Signed)
Pt c/o spotting on and off since Saturday, cramping last night, pelvic pain, and decreased fetal movement.

## 2012-09-22 ENCOUNTER — Other Ambulatory Visit: Payer: BC Managed Care – PPO

## 2012-09-22 ENCOUNTER — Ambulatory Visit (INDEPENDENT_AMBULATORY_CARE_PROVIDER_SITE_OTHER): Payer: BC Managed Care – PPO | Admitting: Obstetrics and Gynecology

## 2012-09-22 ENCOUNTER — Encounter: Payer: Self-pay | Admitting: Obstetrics and Gynecology

## 2012-09-22 ENCOUNTER — Ambulatory Visit (INDEPENDENT_AMBULATORY_CARE_PROVIDER_SITE_OTHER): Payer: BC Managed Care – PPO

## 2012-09-22 VITALS — BP 102/64 | Wt 129.0 lb

## 2012-09-22 DIAGNOSIS — O09219 Supervision of pregnancy with history of pre-term labor, unspecified trimester: Secondary | ICD-10-CM

## 2012-09-22 DIAGNOSIS — R102 Pelvic and perineal pain: Secondary | ICD-10-CM

## 2012-09-22 DIAGNOSIS — Z331 Pregnant state, incidental: Secondary | ICD-10-CM

## 2012-09-22 MED ORDER — HYDROXYPROGESTERONE CAPROATE 250 MG/ML IM OIL
250.0000 mg | TOPICAL_OIL | Freq: Once | INTRAMUSCULAR | Status: AC
  Administered 2012-09-22: 250 mg via INTRAMUSCULAR

## 2012-09-22 NOTE — Progress Notes (Signed)
F/u u/s Anterior placenta Normal fluid Cervical length 4.6cm

## 2012-09-22 NOTE — Progress Notes (Signed)
[redacted]w[redacted]d Pt feels better today [place] is reassuring Continue weekly 17 P

## 2012-09-23 ENCOUNTER — Other Ambulatory Visit: Payer: BC Managed Care – PPO

## 2012-09-25 LAB — US OB LIMITED

## 2012-09-28 ENCOUNTER — Ambulatory Visit: Payer: BC Managed Care – PPO | Admitting: Obstetrics and Gynecology

## 2012-09-29 ENCOUNTER — Other Ambulatory Visit: Payer: BC Managed Care – PPO

## 2012-09-29 DIAGNOSIS — O09219 Supervision of pregnancy with history of pre-term labor, unspecified trimester: Secondary | ICD-10-CM

## 2012-09-29 MED ORDER — HYDROXYPROGESTERONE CAPROATE 250 MG/ML IM OIL
250.0000 mg | TOPICAL_OIL | Freq: Once | INTRAMUSCULAR | Status: AC
  Administered 2012-09-29: 250 mg via INTRAMUSCULAR

## 2012-10-06 ENCOUNTER — Other Ambulatory Visit (INDEPENDENT_AMBULATORY_CARE_PROVIDER_SITE_OTHER): Payer: BC Managed Care – PPO

## 2012-10-06 DIAGNOSIS — O09219 Supervision of pregnancy with history of pre-term labor, unspecified trimester: Secondary | ICD-10-CM

## 2012-10-06 MED ORDER — HYDROXYPROGESTERONE CAPROATE 250 MG/ML IM OIL
250.0000 mg | TOPICAL_OIL | Freq: Once | INTRAMUSCULAR | Status: AC
  Administered 2012-10-06: 250 mg via INTRAMUSCULAR

## 2012-10-13 ENCOUNTER — Encounter: Payer: Self-pay | Admitting: Obstetrics and Gynecology

## 2012-10-13 ENCOUNTER — Ambulatory Visit (INDEPENDENT_AMBULATORY_CARE_PROVIDER_SITE_OTHER): Payer: BC Managed Care – PPO

## 2012-10-13 ENCOUNTER — Ambulatory Visit (INDEPENDENT_AMBULATORY_CARE_PROVIDER_SITE_OTHER): Payer: BC Managed Care – PPO | Admitting: Obstetrics and Gynecology

## 2012-10-13 ENCOUNTER — Other Ambulatory Visit: Payer: BC Managed Care – PPO

## 2012-10-13 VITALS — BP 90/60 | Wt 131.0 lb

## 2012-10-13 DIAGNOSIS — Z331 Pregnant state, incidental: Secondary | ICD-10-CM

## 2012-10-13 DIAGNOSIS — Z8751 Personal history of pre-term labor: Secondary | ICD-10-CM

## 2012-10-13 DIAGNOSIS — O09219 Supervision of pregnancy with history of pre-term labor, unspecified trimester: Secondary | ICD-10-CM

## 2012-10-13 MED ORDER — HYDROXYPROGESTERONE CAPROATE 250 MG/ML IM OIL
250.0000 mg | TOPICAL_OIL | Freq: Once | INTRAMUSCULAR | Status: AC
  Administered 2012-10-13: 250 mg via INTRAMUSCULAR

## 2012-10-13 NOTE — Progress Notes (Signed)
[redacted]w[redacted]d S=d cx 3.62 cm Normal adnexa Breech pres Ant placenta Pt without c/o She is planning to fly.  Travel precautions given glc and [place] for growth for hypocoiled umbical cord @[place]  QUAd screen WNL

## 2012-10-13 NOTE — Patient Instructions (Signed)
Pregnancy and Travel Most pregnant woman can safely travel until the last month of the pregnancy. If you are planning to travel while pregnant, the best time is between 14 and 28 weeks of the pregnancy. During this period of the pregnancy, morning sickness should be minimal and very few problems should develop. Here are a few things to remember when traveling:  Get a copy of your medical records and take them with you.  Limit or avoid travel if you have medical problems or problems with your pregnancy.  Discuss any trip with your caregiver and get examined shortly before leaving.  Get a good night's sleep before leaving.  Take your pillow with you, if there is room in your luggage.  Try to get names of doctors in the area you will be visiting.  Wear flat, comfortable shoes.  Rest when at your destination.  Eat a balanced diet, take your vitamins and supplements, and drink a lot of fluids.  Do not wear yourself out.  Do not ride on a motorcycle when pregnant. TRAVEL BY CAR  Wear your seat belt properly.  Sit as far away from the dashboard, if you are in the front seat, to avoid getting hit hard if the air bag deploys in an accident.  Stop about every 2 hours to use the restroom and walk around. This helps the circulation in your legs.  Keep water, crackers, and fruit in the car.  Do not travel for more than 6 hours a day.  Take a charged cell phone with you. TRAVEL BY BUS You are more confined in a bus making it hard to walk around every couple of hours.  Get out and walk around if and when the bus stops.  Move your arms and legs when seated. This helps with your circulation.  Take water, crackers, and fruit with you.  Most buses traveling long distances will have a restroom. Ask about that when making your reservation.  After a long trip, lie down for 30 or more minutes with your feet slightly raised.  Take a charged cell phone with you. TRAVEL BY TRAIN  There  usually is more than one restroom.  There is a dining room for meals.  There usually are sleepers for overnight and long trips. Ask about sleepers when making your reservation.  It is a good idea to lie down for 30 minutes with your feet elevated after your trip.  Take a charged cell phone with you. TRAVEL BY PLANE  Pregnant women may be restricted from [place] after a certain time of the pregnancy. Every airline has its own rules and regulations. You should ask about them when making your reservation.  Usually there are only 2 or 3 restrooms available for 150 or more passengers.  You should not fly above 7000 feet in an unpressurized plane.  You cannot get up and walk around freely.  Wear your seatbelt at all times.  Put all your medications and medical records in your carry-on bag.  Take water, crackers, and fruit with you.  Try to get a bulkhead or an aisle seat.  Avoid caffeine drinks and do not eat a big meal before flying.  There are special meals you can order when making your reservation, if the airline is serving meals.  Wear layered clothing because the temperature in the cabin can change.  Move your arms and legs while sitting to help with your circulation.  After a long trip, lie down for 30 or more minutes  with your feet slightly elevated.  Take a charged cell phone with you. TRAVEL BY CRUISE SHIP  You should check with the cruise line and ask several questions, such as:  Are pregnant women allowed on the cruise?  Is there a medical facility and doctor on board?  Does the ship dock in cities where there are doctors and medical facilities?  Rest and lie down with your feet raised for 30 or more minutes after arriving.  Take a charged cell phone with you.  Ask your caregiver if:  Any medications are safe for you to take if you get seasick.  It is safe for you to wear acupressure wristbands to prevent getting seasick. TRAVEL TO A FOREIGN  COUNTRY  Discuss the trip with your caregiver.  Get copies of your medical records and have them with you at all times.  Take your medications and important documents with you in your carry-on bag.  Ask your caregiver if there are any medications that are safe to take for diarrhea, constipation, nausea, or vomiting.  Check to be sure you have the required vaccinations for entry into that country. The Center for Disease Control and Prevention (CDC) can advise you [phone]) about vaccination requirements.  Ask your medical insurance company if you and your baby will have insurance coverage while traveling outside the country. Emergency medical and evacuation insurance information is available at the following website: www.travel.guard.com  Before making plans to go to a foreign country, call the [employer] for the name of the [employer] to get information regarding the embassy's location, weather, any prevalent disease problems, crime, and other questions you might have.  It is a good idea to make copies of your medical records, passport, and other important papers, in case you lose the originals.  Do not eat uncooked foods of any kind.  Drink bottled water and do not use ice.  Wash fruits and vegetables with soapy hot water.  Only drink pasteurized milk.  After you arrive, ask for the location of doctors and hospitals.  After arriving, lie down for 30 minutes or more and get some rest.  Your cell phone may not work in many foreign countries. Ask your mobile phone carrier about cell phone coverage. Document Released: 09/19/2008 Document Revised: 12/30/2011 Document Reviewed: 09/19/2008 [facility] Patient Information 2013 [place], [place].

## 2012-10-13 NOTE — Progress Notes (Signed)
Pt w/o complaint  17 P given today

## 2012-10-16 LAB — US OB FOLLOW UP

## 2012-10-20 ENCOUNTER — Other Ambulatory Visit (INDEPENDENT_AMBULATORY_CARE_PROVIDER_SITE_OTHER): Payer: BC Managed Care – PPO

## 2012-10-20 DIAGNOSIS — O09219 Supervision of pregnancy with history of pre-term labor, unspecified trimester: Secondary | ICD-10-CM

## 2012-10-20 DIAGNOSIS — O4700 False labor before 37 completed weeks of gestation, unspecified trimester: Secondary | ICD-10-CM

## 2012-10-20 MED ORDER — HYDROXYPROGESTERONE CAPROATE 250 MG/ML IM OIL
250.0000 mg | TOPICAL_OIL | Freq: Once | INTRAMUSCULAR | Status: AC
  Administered 2012-10-20: 250 mg via INTRAMUSCULAR

## 2012-10-21 NOTE — L&D Delivery Note (Signed)
Delivery Note At 1:25 PM a viable female was delivered via Vaginal, Spontaneous Delivery (Presentation: Right Occiput Anterior).  APGAR: 7, 9; weight 7 lb 10 oz (3459 g).   Placenta status: Intact, Spontaneous [place].  Cord: 3 vessels with no complications  Cord pH: cord blood donor  Anesthesia: Epidural  Episiotomy: None Lacerations: None Suture Repair: n/a Est. Blood Loss (mL): 350  Mom to postpartum.  Baby placed skin to skin.  Haroldine Laws 01/29/2013, 5:25 PM

## 2012-10-23 ENCOUNTER — Telehealth: Payer: Self-pay | Admitting: Obstetrics and Gynecology

## 2012-10-23 NOTE — Telephone Encounter (Signed)
Tc to pt regarding msg.  Pt is [redacted]w[redacted]d, states she thinks she is having contractions, is having cramping on/off located mid abdominal area.  Pt is currently in [place] and is to take a flight back home tomorrow @ 1300.  Pt does report +fm.  Pt says has been on her feet a lot while there and has not been drinking much fluids.  Pt advised that she needs rest and put feet up and push fluids and will consult w/ SL.  Per SL, advised pt to go to nearest hospital for an evaluation.  Pt asks if she could rest and see how it goes b/c she has not seen her brother in 3 months.  Pt encouraged to go to hospital for evaluation for the health of her and her baby and to avoid any problems when she gets on the plane tomorrow.  Pt states she will go to the hospital.

## 2012-10-25 ENCOUNTER — Inpatient Hospital Stay (HOSPITAL_COMMUNITY)
Admission: AD | Admit: 2012-10-25 | Discharge: 2012-10-25 | Disposition: A | Discharge status: Home / Self Care | Payer: Medicaid Other | Source: Ambulatory Visit | Attending: Obstetrics and Gynecology | Admitting: Obstetrics and Gynecology

## 2012-10-25 ENCOUNTER — Encounter (HOSPITAL_COMMUNITY): Payer: Self-pay | Admitting: Family

## 2012-10-25 DIAGNOSIS — N949 Unspecified condition associated with female genital organs and menstrual cycle: Secondary | ICD-10-CM | POA: Insufficient documentation

## 2012-10-25 DIAGNOSIS — R109 Unspecified abdominal pain: Secondary | ICD-10-CM

## 2012-10-25 DIAGNOSIS — O99891 Other specified diseases and conditions complicating pregnancy: Secondary | ICD-10-CM | POA: Insufficient documentation

## 2012-10-25 DIAGNOSIS — O4700 False labor before 37 completed weeks of gestation, unspecified trimester: Secondary | ICD-10-CM

## 2012-10-25 LAB — URINALYSIS, ROUTINE W REFLEX MICROSCOPIC
Bilirubin Urine: NEGATIVE
Glucose, UA: NEGATIVE mg/dL
Hgb urine dipstick: NEGATIVE
Ketones, ur: NEGATIVE mg/dL
pH: 6.5 (ref 5.0–8.0)

## 2012-10-25 LAB — FETAL FIBRONECTIN: Fetal Fibronectin: NEGATIVE

## 2012-10-25 LAB — WET PREP, GENITAL: Trich, Wet Prep: NONE SEEN

## 2012-10-25 NOTE — MAU Note (Addendum)
Patient reports to MAU with c/o lower abdominal pressure x 3 days; has been traveling over the weekend and returned home at 0400 today; was on feet most of day Friday, rested Saturday and pressure has improved. Was advised to go to hospital in [place] where she was traveling, and patient decided to come in after she returned home. States no pressure at this time, however is 6/10 pressure when standing. Denies vaginal bleeding or antecedent intercourse/trauma. Noted watery discharge yesterday but none since. Reports good fetal movement. Hx of PTD at 32wks in 2009.

## 2012-10-25 NOTE — MAU Provider Note (Signed)
History   27 yo [id number] at 24 3/7 weeks presented unannounced c/o cramping and pelvic pressure starting on Thursday--was in [place] for brother's graduation from basic training.  Called the CNM on call and was directed to be seen in the ER there, but patient elected to rest, push fluids, and observe.  She returned home early this am and presented here for evaluation.  She reports cramping has significantly decreased.  Reported some ? Leaking this am, but denies bleeding or recent IC.  Reports +FM.  Hx remarkable for: Patient Active Problem List  Diagnosis  . Pregnant state, incidental  . Prior pregnancy with placenta abruption, antepartum  . History of ectopic pregnancy  . Preterm labor second trimester with preterm delivery third trimester  . Preterm delivery  . Flu vaccine need  . Anomaly of umbilical cord  Had abruption at 32 weeks with previous pregnancy, with onset of active labor simultaneously.   Delivered vaginally, with infant in good condition. On 17P this pregnancy, with next dose due Tuesday, 10/28/11.  Chief Complaint  Patient presents with  . Pelvic Pain     OB History    Grav Para Term Preterm Abortions TAB SAB Ect Mult Living   4 2 1 1 1  0 0 1 0 2      Past Medical History  Diagnosis Date  . UTI (urinary tract infection)   . Ectopic pregnancy   . Spinal headache     ? bad migraines following delivery  . Irregular heartbeat 2009    Hx of Echo done  . Preterm labor 2008    Late 2nd or Early 3rd trimester PTL, injection given to stop contractions, was d/c home w/ meds  . Preterm labor 2009    Preterm birth @ 32 wks; Abrupted placenta  . Preterm labor 2009    Prior to delivery of 2nd child;had contractions frequently  . Infection     BV;not frequent  . Infection     UTI;not frequent    Past Surgical History  Procedure Date  . No past surgeries     Family History  Problem Relation Age of Onset  . Hearing loss Neg Hx   . Other Mother     Charcot Byrd Hesselbach  tooth  . Other Maternal Grandfather     Charcot Maria tooth  . Cancer Maternal Aunt     Breast;before menopause  . Cerebral palsy Sister     Twins;1/2 sisters    History  Substance Use Topics  . Smoking status: Never Smoker   . Smokeless tobacco: Never Used  . Alcohol Use: No    Allergies: No Known Allergies  Prescriptions prior to admission  Medication Sig Dispense Refill  . butalbital-acetaminophen-caffeine (FIORICET, ESGIC) 50-325-40 MG per tablet Take 1 tablet by mouth 2 (two) times daily as needed for headache.  30 tablet  0  . ibuprofen (ADVIL,MOTRIN) 600 MG tablet Take 1 tablet (600 mg total) by mouth every 6 (six) hours. Take routinely for next 1-2 days, then as needed for cramping.  36 tablet  2  . ondansetron (ZOFRAN) 8 MG tablet Take 8 mg by mouth every 8 (eight) hours as needed.      . Prenatal Vit-Fe Fumarate-FA (PRENATAL MULTIVITAMIN) TABS Take 1 tablet by mouth daily.         Physical Exam   Blood pressure 103/69, pulse 88, temperature 98 F (36.7 C), temperature source Oral, resp. rate 18, height 5\' 3"  (1.6 m), weight 133 lb (60.328 kg), last  menstrual period 05/07/2012.  Chest clear Heart RRR without murmur Abd gravid, NT Pelvic--small amount thin clear d/c in vault.  No pooling.  Cervix appears closed, no blood in vault. Ext WNL  FHR reassuring, with moderate variability noted.  No decels No UCs.    ED Course  IUP at 24 3/7 weeks Cramping episode Hx abruption at 32 weeks with last pregnancy  Plan: FFN, amnisure, wet prep, UA, GBS, GC, chlamydia. Monitor for contractions. Await FFN and amnisure.   Nigel Bridgeman CNM, [place] 10/25/2012 2:09 PM   Addendum: FFN and amnisure negative. No contractions, FHR reassuring.  Results for orders placed during the hospital encounter of 10/25/12 (from the past 24 hour(s))  URINALYSIS, ROUTINE W REFLEX MICROSCOPIC     Status: Abnormal   Collection Time   10/25/12  1:38 PM      Component Value Range   Color,  Urine YELLOW  YELLOW   APPearance HAZY (*) CLEAR   Specific Gravity, Urine 1.015  1.005 - 1.030   pH 6.5  5.0 - 8.0   Glucose, UA NEGATIVE  NEGATIVE mg/dL   Hgb urine dipstick NEGATIVE  NEGATIVE   Bilirubin Urine NEGATIVE  NEGATIVE   Ketones, ur NEGATIVE  NEGATIVE mg/dL   Protein, ur NEGATIVE  NEGATIVE mg/dL   Urobilinogen, UA 1.0  0.0 - 1.0 mg/dL   Nitrite NEGATIVE  NEGATIVE   Leukocytes, UA NEGATIVE  NEGATIVE  FETAL FIBRONECTIN     Status: Normal   Collection Time   10/25/12  2:02 PM      Component Value Range   Fetal Fibronectin NEGATIVE  NEGATIVE  WET PREP, GENITAL     Status: Abnormal   Collection Time   10/25/12  2:03 PM      Component Value Range   Yeast Wet Prep HPF POC NONE SEEN  NONE SEEN   Trich, Wet Prep NONE SEEN  NONE SEEN   Clue Cells Wet Prep HPF POC NONE SEEN  NONE SEEN   WBC, Wet Prep HPF POC MODERATE (*) NONE SEEN  AMNISURE RUPTURE OF MEMBRANE (ROM)     Status: Normal   Collection Time   10/25/12  2:04 PM      Component Value Range   Amnisure ROM NEGATIVE     D/C'd home with PTL instructions. Keep scheduled appt for 17P on Tuesday this week and as scheduled. Pelvic rest and increase general rest and fluids.  Nigel Bridgeman, CNM 10/25/12 3:05p

## 2012-10-25 NOTE — MAU Note (Signed)
Pt was in [place] on Friday, started cramping, having back pain.  Flew home last night - now having pelvic pressure, no cramping now.  Denies bleeding or LOF.

## 2012-10-25 NOTE — MAU Note (Signed)
Hx of PTD, baby born @ 32 weeks.

## 2012-10-26 LAB — URINE CULTURE: Colony Count: NO GROWTH

## 2012-10-26 LAB — GC/CHLAMYDIA PROBE AMP
CT Probe RNA: NEGATIVE
GC Probe RNA: NEGATIVE

## 2012-10-27 ENCOUNTER — Other Ambulatory Visit: Payer: BC Managed Care – PPO

## 2012-10-27 DIAGNOSIS — O09219 Supervision of pregnancy with history of pre-term labor, unspecified trimester: Secondary | ICD-10-CM

## 2012-10-27 LAB — CULTURE, BETA STREP (GROUP B ONLY)

## 2012-10-27 MED ORDER — HYDROXYPROGESTERONE CAPROATE 250 MG/ML IM OIL
250.0000 mg | TOPICAL_OIL | Freq: Once | INTRAMUSCULAR | Status: AC
  Administered 2012-10-27: 250 mg via INTRAMUSCULAR

## 2012-11-03 ENCOUNTER — Other Ambulatory Visit: Payer: BC Managed Care – PPO

## 2012-11-06 ENCOUNTER — Encounter: Payer: Self-pay | Admitting: Obstetrics and Gynecology

## 2012-11-06 ENCOUNTER — Ambulatory Visit: Payer: BC Managed Care – PPO

## 2012-11-06 ENCOUNTER — Other Ambulatory Visit: Payer: BC Managed Care – PPO

## 2012-11-06 ENCOUNTER — Ambulatory Visit: Payer: BC Managed Care – PPO | Admitting: Obstetrics and Gynecology

## 2012-11-06 VITALS — BP 96/58 | Wt 134.0 lb

## 2012-11-06 DIAGNOSIS — Z8751 Personal history of pre-term labor: Secondary | ICD-10-CM

## 2012-11-06 DIAGNOSIS — Z331 Pregnant state, incidental: Secondary | ICD-10-CM

## 2012-11-06 LAB — HEMOGLOBIN: Hemoglobin: 11.8 g/dL — ABNORMAL LOW (ref 12.0–15.0)

## 2012-11-06 MED ORDER — HYDROXYPROGESTERONE CAPROATE 250 MG/ML IM OIL
250.0000 mg | TOPICAL_OIL | Freq: Once | INTRAMUSCULAR | Status: AC
  Administered 2012-11-06: 250 mg via INTRAMUSCULAR

## 2012-11-06 NOTE — Progress Notes (Signed)
[redacted]w[redacted]d GTT given today

## 2012-11-06 NOTE — Progress Notes (Signed)
[redacted]w[redacted]d  GFM  occ BH  No change in D/C Growth ultrasound: AGA with normal AFI Traveling to [place] this week-end: precautions given

## 2012-11-06 NOTE — Progress Notes (Signed)
Ultrasound shows:  SIUP  S=D     [place] EDD: 01/2413           EFW: 2 lbs 2 oz 56th%           AFI: n/a           Cervical length: 3.95 cm           Placenta localization: anterior           Fetal presentation: vertex Comments: Normal fluid, AP pocket = 4.7 cm Normal linear growth No late presenting fetal abnormalities.  CX closed. Normal adnexa

## 2012-11-07 LAB — GLUCOSE TOLERANCE, 1 HOUR (50G) W/O FASTING: Glucose, 1 Hour GTT: 102 mg/dL (ref 70–140)

## 2012-11-07 LAB — RPR

## 2012-11-09 LAB — US OB FOLLOW UP

## 2012-11-16 ENCOUNTER — Other Ambulatory Visit: Payer: BC Managed Care – PPO

## 2012-11-16 DIAGNOSIS — O09219 Supervision of pregnancy with history of pre-term labor, unspecified trimester: Secondary | ICD-10-CM

## 2012-11-16 MED ORDER — HYDROXYPROGESTERONE CAPROATE 250 MG/ML IM OIL
250.0000 mg | TOPICAL_OIL | Freq: Once | INTRAMUSCULAR | Status: AC
  Administered 2012-11-16: 250 mg via INTRAMUSCULAR

## 2012-11-20 ENCOUNTER — Ambulatory Visit: Payer: BC Managed Care – PPO | Admitting: Obstetrics and Gynecology

## 2012-11-20 VITALS — BP 90/60 | Wt 136.0 lb

## 2012-11-20 DIAGNOSIS — Z331 Pregnant state, incidental: Secondary | ICD-10-CM

## 2012-11-20 NOTE — Progress Notes (Signed)
[redacted]w[redacted]d U/s in 2wks for EFW secondary to hypocoiled umbilical cord Cont 17P qwk (Mondays) No complaints Questions answered Reviewed lab work

## 2012-11-23 ENCOUNTER — Telehealth: Payer: Self-pay | Admitting: Advanced Practice Midwife

## 2012-11-23 ENCOUNTER — Other Ambulatory Visit: Payer: BC Managed Care – PPO

## 2012-11-23 DIAGNOSIS — O09219 Supervision of pregnancy with history of pre-term labor, unspecified trimester: Secondary | ICD-10-CM

## 2012-11-23 MED ORDER — HYDROXYPROGESTERONE CAPROATE 250 MG/ML IM OIL
250.0000 mg | TOPICAL_OIL | Freq: Once | INTRAMUSCULAR | Status: AC
  Administered 2012-11-23: 250 mg via INTRAMUSCULAR

## 2012-11-23 NOTE — Telephone Encounter (Signed)
Few episodes of brief, sharp rectal pain tonight. Unsure if she is contracating. No VB, LOF. Denies constipation. Last BM this morning. Hx PTD due to abruption, on 17-P. Discussed how to monitor for contractions. Increase fluids and rest. Come to MAU for >5 UC's per hour.

## 2012-11-26 ENCOUNTER — Ambulatory Visit: Payer: Medicaid Other | Admitting: Certified Nurse Midwife

## 2012-11-26 ENCOUNTER — Observation Stay (HOSPITAL_COMMUNITY)
Admission: AD | Admit: 2012-11-26 | Discharge: 2012-11-29 | Disposition: A | Discharge status: Home / Self Care | Payer: Medicaid Other | Source: Ambulatory Visit | Attending: Obstetrics and Gynecology | Admitting: Obstetrics and Gynecology

## 2012-11-26 ENCOUNTER — Telehealth: Payer: Self-pay | Admitting: Obstetrics and Gynecology

## 2012-11-26 ENCOUNTER — Encounter (HOSPITAL_COMMUNITY): Payer: Self-pay | Admitting: *Deleted

## 2012-11-26 ENCOUNTER — Encounter: Payer: Self-pay | Admitting: Certified Nurse Midwife

## 2012-11-26 VITALS — BP 90/58 | Wt 136.0 lb

## 2012-11-26 DIAGNOSIS — IMO0001 Reserved for inherently not codable concepts without codable children: Secondary | ICD-10-CM | POA: Insufficient documentation

## 2012-11-26 DIAGNOSIS — R109 Unspecified abdominal pain: Secondary | ICD-10-CM

## 2012-11-26 DIAGNOSIS — Z331 Pregnant state, incidental: Secondary | ICD-10-CM

## 2012-11-26 DIAGNOSIS — O4700 False labor before 37 completed weeks of gestation, unspecified trimester: Principal | ICD-10-CM | POA: Insufficient documentation

## 2012-11-26 DIAGNOSIS — O368190 Decreased fetal movements, unspecified trimester, not applicable or unspecified: Secondary | ICD-10-CM

## 2012-11-26 DIAGNOSIS — F418 Other specified anxiety disorders: Secondary | ICD-10-CM

## 2012-11-26 LAB — URINALYSIS, ROUTINE W REFLEX MICROSCOPIC
Bilirubin Urine: NEGATIVE
Glucose, UA: NEGATIVE mg/dL
Ketones, ur: 15 mg/dL — AB
Leukocytes, UA: NEGATIVE
Specific Gravity, Urine: 1.01 (ref 1.005–1.030)
pH: 6.5 (ref 5.0–8.0)

## 2012-11-26 LAB — POCT URINALYSIS DIPSTICK
Nitrite, UA: NEGATIVE
Protein, UA: NEGATIVE
pH, UA: 6

## 2012-11-26 LAB — CBC
Hemoglobin: 11.4 g/dL — ABNORMAL LOW (ref 12.0–15.0)
MCHC: 34.1 g/dL (ref 30.0–36.0)
RDW: 13.6 % (ref 11.5–15.5)

## 2012-11-26 MED ORDER — BETAMETHASONE SOD PHOS & ACET 6 (3-3) MG/ML IJ SUSP
12.0000 mg | Freq: Once | INTRAMUSCULAR | Status: AC
  Administered 2012-11-26: 12 mg via INTRAMUSCULAR
  Filled 2012-11-26: qty 2

## 2012-11-26 MED ORDER — LACTATED RINGERS IV SOLN
INTRAVENOUS | Status: DC
  Administered 2012-11-26 – 2012-11-29 (×10): via INTRAVENOUS

## 2012-11-26 MED ORDER — ACETAMINOPHEN 325 MG PO TABS
650.0000 mg | ORAL_TABLET | ORAL | Status: DC | PRN
  Administered 2012-11-28: 650 mg via ORAL
  Filled 2012-11-26: qty 2

## 2012-11-26 MED ORDER — NIFEDIPINE 10 MG PO CAPS: 10.0000 mg | ORAL_CAPSULE | Freq: Four times a day (QID) | ORAL | Status: DC

## 2012-11-26 MED ORDER — NIFEDIPINE 10 MG PO CAPS
10.0000 mg | ORAL_CAPSULE | Freq: Once | ORAL | Status: AC
  Administered 2012-11-26: 10 mg via ORAL
  Filled 2012-11-26: qty 1

## 2012-11-26 MED ORDER — LACTATED RINGERS IV BOLUS (SEPSIS)
500.0000 mL | Freq: Once | INTRAVENOUS | Status: AC
  Administered 2012-11-26: 500 mL via INTRAVENOUS

## 2012-11-26 MED ORDER — NIFEDIPINE 10 MG PO CAPS
10.0000 mg | ORAL_CAPSULE | ORAL | Status: DC | PRN
  Administered 2012-11-27: 10 mg via ORAL
  Filled 2012-11-26 (×2): qty 1

## 2012-11-26 MED ORDER — NALBUPHINE SYRINGE 5 MG/0.5 ML
10.0000 mg | INJECTION | Freq: Once | INTRAMUSCULAR | Status: AC
  Administered 2012-11-26: 10 mg via INTRAVENOUS
  Filled 2012-11-26: qty 1

## 2012-11-26 MED ORDER — NIFEDIPINE 10 MG PO CAPS
10.0000 mg | ORAL_CAPSULE | Freq: Once | ORAL | Status: AC
  Administered 2012-11-26: 10 mg via ORAL

## 2012-11-26 MED ORDER — SERTRALINE HCL 25 MG PO TABS: 25.0000 mg | ORAL_TABLET | Freq: Every day | ORAL | Status: DC

## 2012-11-26 MED ORDER — ZOLPIDEM TARTRATE 5 MG PO TABS
5.0000 mg | ORAL_TABLET | Freq: Every evening | ORAL | Status: DC | PRN
  Administered 2012-11-28: 5 mg via ORAL
  Filled 2012-11-26: qty 1

## 2012-11-26 MED ORDER — NIFEDIPINE 10 MG PO CAPS
ORAL_CAPSULE | ORAL | Status: AC
  Administered 2012-11-26: 10 mg via ORAL
  Filled 2012-11-26: qty 1

## 2012-11-26 MED ORDER — HYDROXYZINE HCL 50 MG PO TABS
50.0000 mg | ORAL_TABLET | Freq: Three times a day (TID) | ORAL | Status: DC | PRN
  Administered 2012-11-26 – 2012-11-27 (×2): 50 mg via ORAL
  Filled 2012-11-26: qty 1

## 2012-11-26 MED ORDER — BETAMETHASONE SOD PHOS & ACET 6 (3-3) MG/ML IJ SUSP
12.0000 mg | Freq: Once | INTRAMUSCULAR | Status: AC
  Administered 2012-11-27: 12 mg via INTRAMUSCULAR
  Filled 2012-11-26: qty 2

## 2012-11-26 MED ORDER — DOCUSATE SODIUM 100 MG PO CAPS
100.0000 mg | ORAL_CAPSULE | Freq: Every day | ORAL | Status: DC
  Administered 2012-11-28 – 2012-11-29 (×2): 100 mg via ORAL
  Filled 2012-11-26 (×3): qty 1

## 2012-11-26 MED ORDER — CALCIUM CARBONATE ANTACID 500 MG PO CHEW: 2.0000 | CHEWABLE_TABLET | ORAL | Status: DC | PRN

## 2012-11-26 MED ORDER — PROMETHAZINE HCL 25 MG/ML IJ SOLN
25.0000 mg | Freq: Once | INTRAMUSCULAR | Status: AC
  Administered 2012-11-27: 25 mg via INTRAVENOUS
  Filled 2012-11-26 (×2): qty 1

## 2012-11-26 MED ORDER — PRENATAL MULTIVITAMIN CH
1.0000 | ORAL_TABLET | Freq: Every day | ORAL | Status: DC
  Administered 2012-11-27 – 2012-11-29 (×3): 1 via ORAL
  Filled 2012-11-26 (×3): qty 1

## 2012-11-26 NOTE — H&P (Signed)
Admission History and Physical Exam for an Obstetrics Patient  Jamie Wright is a 27 y.o. female, [phone], at [redacted]w[redacted]d gestation, who presents for evaluation of contractions. She has been followed at the [facility] of [facility].  Her pregnancy has been complicated by a history of a preterm delivery. The patient is currently receiving 17 P because she has had a preterm delivery in the past at [redacted] weeks gestation. She presented today to the office complaining of back pain and pressure. She also admits she is having anxiety and depression symptoms. Her husband works out of town. Beta strep, GC, and Chlamydia were negative on 10/25/2012. Fetal fibronectin negative today. See history below.  OB History    Grav Para Term Preterm Abortions TAB SAB Ect Mult Living   4 2 1 1 1  0 0 1 0 2      Past Medical History  Diagnosis Date  . UTI (urinary tract infection)   . Ectopic pregnancy   . Spinal headache     ? bad migraines following delivery  . Irregular heartbeat 2009    Hx of Echo done  . Preterm labor 2008    Late 2nd or Early 3rd trimester PTL, injection given to stop contractions, was d/c home w/ meds  . Preterm labor 2009    Preterm birth @ 32 wks; Abrupted placenta  . Preterm labor 2009    Prior to delivery of 2nd child;had contractions frequently  . Infection     BV;not frequent  . Infection     UTI;not frequent    Prescriptions prior to admission  Medication Sig Dispense Refill  . Prenatal Vit-Fe Fumarate-FA (PRENATAL MULTIVITAMIN) TABS Take 1 tablet by mouth daily.        Past Surgical History  Procedure Date  . No past surgeries     No Known Allergies  Family History: family history includes Cancer in her maternal aunt; Cerebral palsy in her sister; and Other in her maternal grandfather and mother.  There is no history of Hearing loss.  Social History:  reports that she has never smoked. She has never used  smokeless tobacco. She reports that she does not drink alcohol or use illicit drugs.  Review of systems: Normal pregnancy complaints.  Admission Physical Exam:  Dilation: 1.5 Effacement (%): 50 Station: Ballotable Exam by:: Dr. Stefano Gaul There is no weight on file to calculate BMI.  Blood pressure 112/70, pulse 94, temperature 97 F (36.1 C), temperature source Oral, resp. rate 16, height 5\' 3"  (1.6 m), last menstrual period 05/07/2012, SpO2 99.00%.  HEENT:                 Within normal limits Chest:                   Clear Heart:                    Regular rate and rhythm Abdomen:             Gravid and nontender Extremities:          Grossly normal Neurologic exam: Grossly normal Pelvic exam:         Cervix: 1.5 cm, 50% effaced, ballotable  Prenatal labs: ABO, Rh:             O/POS/-- (09/30 1533) Antibody:              NEG (09/30 1533) Rubella:  RPR:                    NON REAC (01/17 1032)  HBsAg:                 NEGATIVE (09/30 1533)  HIV:                       NON REACTIVE (09/30 1533)  GBS:                         Nonstress test: Category 1; mild contractions every 6-10 minutes even after 10 mg of Procardia.  Ultrasound: Single gestation, vertex, normal fluid, normal fetal heart motions, normal fetal breathing motions, anterior grade 2 placenta.  Assessment:  [redacted]w[redacted]d gestation  Preterm uterine contractions. Negative fetal fibronectin. I am not certain of her cervix has changed or this just reflects an exam by a different provider.  History of preterm delivery.  History of anxiety and depression.  Plan:  We'll plan overnight observation. We'll start an IV line and hydrate the patient. We will give her betamethasone today and again in 24 hours.  Urine culture sent.  Procardia 10 mg every 6 hours as needed for contractions.   [name] 11/26/2012, 6:48 PM

## 2012-11-26 NOTE — Progress Notes (Signed)
107w0d  Pt only has pressure w/standing Pt worked in for abdominal cramping since Sunday, lower sharp pains describes as needles.  Pt states she notices rapid heartbeat w/ cramping. Pt states she has been feeling shaky as well , not sure if it is stress related.   pt states she is feeling baby  move just not as much as usual.

## 2012-11-26 NOTE — MAU Note (Signed)
Patient states she is having contractions frequently. Denies bleeding or leaking and reports good fetal movement. Was seen in the office and had a fFN done and it is pending. Needs to be monitored until result.

## 2012-11-26 NOTE — Progress Notes (Addendum)
   Jamie Wright presents today at [redacted]w[redacted]d gestation for lower abdominal pressure, particularly with standing; and abdominal cramping; and occasional lower abdominal sharp pain since Sunday.  She reports FM but states "baby is not moving as much as usual.    In addition, she is complaining of feeling her heart "beating real fast" and "heavy chest feeling."  She reports this is a similar feeling to the panic attacks she experienced with her first pregnancy.  When it was suggested that this may be depression with anxiety she became very tearful and expressed that she felt it may be depression.  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  She is currently on weekly 17-P, and is scheduled 2/10 for growth [place] secondary to a  hypercoiled cord.  During NST pt reports UCs getting stronger and more frequent.  Her obstetrical history is significant for: Patient Active Problem List  Diagnosis  . Pregnant state, incidental  . Prior pregnancy with placenta abruption, antepartum  . History of ectopic pregnancy  . Preterm labor second trimester with preterm delivery third trimester  . Preterm delivery  . Flu vaccine need  . Anomaly of umbilical cord      Objective:   BP 90/58  Wt 136 lb (61.689 kg)  LMP 05/07/2012 Wt Readings from Last 1 Encounters:  11/26/12 136 lb (61.689 kg)   General: alert, cooperative and tearful  VAGINAL BLEEDING: None EXTERNAL GENITALIA: normal appearing vulva with no masses, tenderness or lesions VAGINA: no abnormal discharge or lesions CERVIX: no lesions or cervical motion tenderness; cervix C / 25% / ballatable UTERUS: gravid, occasional mild UCs palapated  NST: Reactive  UCs: 3-5 min; moderate to palpation   Assessment:   R/o PTL Reactive NST Moderate UCs by palpation Depression with anxiery  Plan:    Send to MAU for evaluation NST Urine Culture sent fFN collected Rx: Zoloft 25mg  QD   Next visit: Appt already scheduled for  2/10 for an [place] for hypercoiled cord and an ROB with Dr. Idelle Crouch, CNM, MSN

## 2012-11-26 NOTE — Telephone Encounter (Signed)
Pt called, is 29 weeks, states spoke w/ a provider over the weekend about the severe abdominal cramping she has been having.  Pt has a hx of preterm labor and is currently on 17P.  Pt states when the cramping comes, it stays there for a while and then goes away, then it comes back again for a while and now the cramping is being accompanied w/ being very nauseaous.  Pt denies any bldg/LOF.  Pt states she is drinking about 7-8 bottles of water/day, but has not taken any meds and also states that the baby did not move as much last pm as she usually does.  Pt is requesting an appt today.  Pt scheduled an appt today w/ JO, CNM for eval.  Pt advised in the mean time to take 2 Tylenol, take a warm bath, pt voices agreement.

## 2012-11-27 ENCOUNTER — Other Ambulatory Visit: Payer: Self-pay

## 2012-11-27 MED ORDER — BUTORPHANOL TARTRATE 1 MG/ML IJ SOLN
1.0000 mg | INTRAMUSCULAR | Status: DC | PRN
  Administered 2012-11-27 – 2012-11-28 (×2): 1 mg via INTRAVENOUS
  Filled 2012-11-27 (×2): qty 1

## 2012-11-27 MED ORDER — ONDANSETRON HCL 4 MG/2ML IJ SOLN
4.0000 mg | Freq: Three times a day (TID) | INTRAMUSCULAR | Status: DC | PRN
  Administered 2012-11-27: 4 mg via INTRAVENOUS
  Filled 2012-11-27: qty 2

## 2012-11-27 MED ORDER — NIFEDIPINE 10 MG PO CAPS: 10.0000 mg | ORAL_CAPSULE | Freq: Once | ORAL | Status: DC

## 2012-11-27 MED ORDER — NIFEDIPINE 10 MG PO CAPS
10.0000 mg | ORAL_CAPSULE | Freq: Once | ORAL | Status: AC
  Administered 2012-11-27: 10 mg via ORAL

## 2012-11-27 MED ORDER — NALBUPHINE SYRINGE 5 MG/0.5 ML
10.0000 mg | INJECTION | INTRAMUSCULAR | Status: DC | PRN
  Administered 2012-11-27 (×3): 10 mg via INTRAVENOUS
  Filled 2012-11-27 (×3): qty 1

## 2012-11-27 MED ORDER — NIFEDIPINE 10 MG PO CAPS
10.0000 mg | ORAL_CAPSULE | ORAL | Status: DC | PRN
  Administered 2012-11-27 – 2012-11-29 (×5): 10 mg via ORAL
  Filled 2012-11-27 (×6): qty 1

## 2012-11-27 MED ORDER — NIFEDIPINE 10 MG PO CAPS
10.0000 mg | ORAL_CAPSULE | ORAL | Status: DC
Start: 2012-11-27 — End: 2012-11-27
  Administered 2012-11-27 (×2): 10 mg via ORAL
  Filled 2012-11-27 (×4): qty 1

## 2012-11-27 MED ORDER — TERBUTALINE SULFATE 1 MG/ML IJ SOLN
INTRAMUSCULAR | Status: AC
  Administered 2012-11-27: 0.25 mg via SUBCUTANEOUS
  Filled 2012-11-27: qty 1

## 2012-11-27 MED ORDER — TERBUTALINE SULFATE 1 MG/ML IJ SOLN
0.2500 mg | Freq: Once | INTRAMUSCULAR | Status: AC
  Administered 2012-11-27 – 2012-11-28 (×2): 0.25 mg via SUBCUTANEOUS

## 2012-11-27 NOTE — Progress Notes (Addendum)
Called to see patient with increased contractions, rating 8/10. Received prn Procardia 10 mg at 6:30pm and Nubain 10 mg at 7:30pm with no benefit. Receiving IV fluid bolus Received 2nd dose Betamethasone at 7:32pm  Filed Vitals:   11/27/12 1515 11/27/12 1518 11/27/12 1827 11/27/12 1935  BP: 93/45 93/45 103/60 110/41  Pulse: 94 94 99 107  Temp:  98.2 F (36.8 C)  98 F (36.7 C)  TempSrc:  Oral  Oral  Resp:  18 18 16   Height:      Weight:      SpO2:       FHR Category 1 UCs q 1-2 min, mild/moderate  Cervix posterior, FT, 50%, soft, vtx -2 (no change from earlier exam)  Consulted with Dr. Su Hilt Terbutaline 0.25 mg SQ now. Will offer repeat pain medication prn.  Nigel Bridgeman, CNM

## 2012-11-27 NOTE — Progress Notes (Signed)
Hospital Day No: 2  Subjective: RN called secondary to pt c/o contractions coming back to back and some are an 8/10.  She denies LOF and reports good FM and no VB.  She had 1st dose of BMZ yest at 8:30pm.  Objective: BP 106/51  Pulse 95  Temp 98.4 F (36.9 C) (Oral)  Resp 18  Ht 5\' 3"  (1.6 m)  Wt 136 lb 14.4 oz (62.097 kg)  BMI 24.25 kg/m2  SpO2 99%  LMP 05/07/2012      Physical Exam:  Gen: alert and no distress, does not appear uncomfortable Chest/Lungs: cta bilaterally  Heart/Pulse: RRR  Abdomen: soft, gravid, nontender Uterine fundus: soft, nontender Skin & Color: warm and dry  EXT: negative Homan's b/l, edema tr  FHT:  FHR: 120s bpm, variability: moderate,  accelerations:  Present,  decelerations:  Absent UC:   Regular q 1-36min when she complained but not picking up now SVE:   Dilation: Closed Effacement (%): 50 Station: Ballotable Exam by:: Dr. Su Hilt  Labs: Lab Results  Component Value Date   WBC 8.8 11/26/2012   HGB 11.4* 11/26/2012   HCT 33.4* 11/26/2012   MCV 91.8 11/26/2012   PLT 173 11/26/2012    Assessment and Plan: G4 P2 at 29 1/7wks admitted with preterm ctxs.  UCx and GBS pending.  Fetal status is overall reassuring.  Improved s/p procardia.  Second dose of BMZ due at 8:30pm.  Will cont to observe and if stable tomorrow, will d/c home.   Jamie Wright Y 11/27/2012, 1:23 PM

## 2012-11-27 NOTE — Progress Notes (Signed)
Patient sleeping now--received SQ Terbutaline at 8:36pm and Stadol at 9;16p with resolution of UCs to mild irritability. FHR Category 1. Will CTO.  Nigel Bridgeman, CNM 11/27/12 9;40p

## 2012-11-27 NOTE — Progress Notes (Signed)
Patient ID: JOYANNA KLEMAN, female   DOB: 12-30-1985, 27 y.o.   MRN: [id number]  S: called by RN w report of more frequent ctx and pt c/o increased pain w ctx  Pt states she feels hot and ctx are taking her breath away at times Pt appears very anxious, worried about her husband that is driving from 4 hours away   O: VSS FHR cat 1 toco occ UC w periods of frequent UI Pelvic no cervical change, cervix very posterior, vtx ballottable  Pt rcvd IVF bolus and 3rd dose of procardia FFN was neg  A: IUP at [redacted]w[redacted]d Hx PTD  Preterm ctx w UI  rcv'd 1 dose of BMZ this evening  P: 2nd dose of BMZ tmrw evening CTO on antenatal  Pt offered ambien or vistaril or IV meds, declined at this time Pt reassured at this time  S.Eulon Allnutt, CNM

## 2012-11-28 LAB — CBC
MCV: 93.5 fL (ref 78.0–100.0)
Platelets: 175 10*3/uL (ref 150–400)
RBC: 3.07 MIL/uL — ABNORMAL LOW (ref 3.87–5.11)
WBC: 11.3 10*3/uL — ABNORMAL HIGH (ref 4.0–10.5)

## 2012-11-28 MED ORDER — TERBUTALINE SULFATE 1 MG/ML IJ SOLN
INTRAMUSCULAR | Status: AC
  Administered 2012-11-28: 0.25 mg via SUBCUTANEOUS
  Filled 2012-11-28: qty 1

## 2012-11-28 MED ORDER — TERBUTALINE SULFATE 2.5 MG PO TABS
2.5000 mg | ORAL_TABLET | Freq: Once | ORAL | Status: DC
  Filled 2012-11-28: qty 1

## 2012-11-28 NOTE — Progress Notes (Signed)
Patient ID: Jamie Wright, female   DOB: 1986/03/08, 27 y.o.   MRN: [id number]  Hospital day # 2 pregnancy at [redacted]w[redacted]d  S: States she continues to have contractions at times.  Received Procardia x 1 approx ago as well as Terbutaline [place] x 1 and Stadol earlier this AM.  She reports      good fetal activity.      She states contractions have been increased in intensity.  Denies other complaints.      Vaginal bleeding:none        Vaginal discharge: none  O: BP 107/44  Pulse 105  Temp(Src) 97.5 F (36.4 C) (Oral)  Resp 20  Ht 5\' 3"  (1.6 m)  Wt 140 lb 9.6 oz (63.776 kg)  BMI 24.91 kg/m2  SpO2 99%  LMP 05/07/2012      Fetal tracings: Fetal heart baseline 140 bpm      Fetal heart variability: moderate      Accels: Present; Decels: Absent      Toco:  Now occas U/C after procardia.  Previously q 1-54mins.        Heart:  RRR      Lungs:  CTA bilat      Abd:  Soft, pos BS x 4 quads         Uterus gravid and non-tender      Extremities: no significant edema and no signs of DVT      SVE Dilation: 0.5cm      Effacement (%):50%      Station: -2      Presentation: Vertex  A: [redacted]w[redacted]d with preterm contractions      P: Dr. Su Hilt updated on no change in SVE.     Will continue to observe at present.       Betamethasone complete.    [name], CNM 11/28/2012 3:32 PM

## 2012-11-29 ENCOUNTER — Inpatient Hospital Stay (HOSPITAL_COMMUNITY)
Admission: AD | Admit: 2012-11-29 | Discharge: 2012-11-30 | Disposition: A | Discharge status: Home / Self Care | Payer: Medicaid Other | Source: Ambulatory Visit | Attending: Obstetrics and Gynecology | Admitting: Obstetrics and Gynecology

## 2012-11-29 DIAGNOSIS — O4700 False labor before 37 completed weeks of gestation, unspecified trimester: Secondary | ICD-10-CM | POA: Insufficient documentation

## 2012-11-29 MED ORDER — NIFEDIPINE 10 MG PO CAPS: 10.0000 mg | ORAL_CAPSULE | ORAL | Status: DC | PRN

## 2012-11-29 MED ORDER — HYDROXYZINE HCL 50 MG PO TABS
50.0000 mg | ORAL_TABLET | Freq: Once | ORAL | Status: AC
  Administered 2012-11-29: 50 mg via ORAL
  Filled 2012-11-29: qty 1

## 2012-11-29 MED ORDER — HYDROXYZINE HCL 50 MG PO TABS: 50.0000 mg | ORAL_TABLET | Freq: Three times a day (TID) | ORAL | Status: DC | PRN

## 2012-11-29 MED ORDER — OXYCODONE-ACETAMINOPHEN 5-325 MG PO TABS
2.0000 | ORAL_TABLET | Freq: Once | ORAL | Status: AC
  Administered 2012-11-29: 2 via ORAL
  Filled 2012-11-29: qty 2

## 2012-11-29 NOTE — Progress Notes (Signed)
Hospital day # 3 pregnancy at [redacted]w[redacted]d--PTL, negative FFN, but cycles of persistent uterine activity.  S:  Aware of cramping now.      Perception of contractions: reports moderate cramping upon awakening this am      Vaginal bleeding: None       Vaginal discharge:  None      Felt mild chest tightness at 9am, but now resolved.  Received Procardia 10 mg prn dose at 7:59am.  O: BP 116/57  Pulse 108  Temp(Src) 98.1 F (36.7 C) (Oral)  Resp 18  Ht 5\' 3"  (1.6 m)  Wt 140 lb 9.6 oz (63.776 kg)  BMI 24.91 kg/m2  SpO2 96%  LMP 05/07/2012      Fetal tracings:  Category 1      Contractions:   Irritability, with sporadic more defined UCs      Uterus gravid and non-tender      Extremities: no significant edema and no signs of DVT           Meds: Received prn Procardia 10 mg at 7:59pm.  A: [redacted]w[redacted]d with PTL, negative FFN     Recurrent cycles of contractions.     Stable  P: Continue current plan of care      VE deferred at present.      Completed Betamethasone course 11/28/12.      Discussed status with patient--difficult to determine best plan of care for contraction control, in light of negative FFN      and hx of stable cervical exam.  Will consult with Dr. Su Hilt regarding plan of care.      MDs will follow  Nigel Bridgeman CNM, [place] 11/29/2012 9:17 AM

## 2012-11-29 NOTE — Discharge Summary (Signed)
Physician Discharge Summary  Patient ID: Jamie Wright MRN: [id number] DOB/AGE: [birth year] 25 y.o.  Admit date: 11/26/2012 Discharge date: 11/29/2012  Admission Diagnoses: Preterm contractions  Discharge Diagnoses:  Preterm contractions Negative FFN  Discharged Condition: stable  Hospital Course: Admitted 11/26/12 for cramping from the office--negative FFN, and cervix closed on office exam.  Cervix noted by Dr. Stefano Gaul to be 2 cm after re-check in MAU.  Patient admitted for betamethasone course.  Has bee on 17P due to hx of previous premature birth at 70 weeks in 2009.  Sporadic runs of contractions treated with prn Procardia, IV hydration, a single dose of SQ Terbutaline, and IV pain medication.  She also was placed on Vistaril for anxiety with benefit.  During the next 2 days, her cervix was rechecked several times, with exam noted to be FT, 50% , vtx, -2.  On 11/29/12, Dr. Su Hilt was consulted, with plan made for discharge if cervix unchanged, in light of negative FFN.  Cervix was stable at FT by Nigel Bridgeman, CNM, with reactive FHR, moderate irritability noted at times.  She was d/c'd home to increase rest, push fluids, be on pelvic rest, and to use Procardia 10 mg po q 4 hours prn increased contractions.  She already had an appointment scheduled at [facility] on 2/10 for ROB visit and [place] to f/u on growth due to hypocoiled cord.  PTL precautions were reviewed.  Consults: None  Significant Diagnostic Studies:   Results for orders placed during the hospital encounter of 11/26/12 (from the past 72 hour(s))  URINALYSIS, ROUTINE W REFLEX MICROSCOPIC     Status: Abnormal   Collection Time    11/26/12  5:30 PM      Result Value Range   Color, Urine YELLOW  YELLOW   APPearance HAZY (*) CLEAR   Specific Gravity, Urine 1.010  1.005 - 1.030   pH 6.5  5.0 - 8.0   Glucose, UA NEGATIVE  NEGATIVE mg/dL   Hgb urine dipstick NEGATIVE  NEGATIVE   Bilirubin Urine NEGATIVE  NEGATIVE   Ketones, ur 15 (*)  NEGATIVE mg/dL   Protein, ur NEGATIVE  NEGATIVE mg/dL   Urobilinogen, UA 0.2  0.0 - 1.0 mg/dL   Nitrite NEGATIVE  NEGATIVE   Leukocytes, UA NEGATIVE  NEGATIVE   Comment: MICROSCOPIC NOT DONE ON URINES WITH NEGATIVE PROTEIN, BLOOD, LEUKOCYTES, NITRITE, OR GLUCOSE <1000 mg/dL.  CBC     Status: Abnormal   Collection Time    11/26/12  6:50 PM      Result Value Range   WBC 8.8  4.0 - 10.5 K/uL   RBC 3.64 (*) 3.87 - 5.11 MIL/uL   Hemoglobin 11.4 (*) 12.0 - 15.0 g/dL   HCT 14.7 (*) 82.9 - 56.2 %   MCV 91.8  78.0 - 100.0 fL   MCH 31.3  26.0 - 34.0 pg   MCHC 34.1  30.0 - 36.0 g/dL   RDW 13.0  86.5 - 78.4 %   Platelets 173  150 - 400 K/uL  CBC     Status: Abnormal   Collection Time    11/28/12  3:07 PM      Result Value Range   WBC 11.3 (*) 4.0 - 10.5 K/uL   RBC 3.07 (*) 3.87 - 5.11 MIL/uL   Hemoglobin 9.7 (*) 12.0 - 15.0 g/dL   HCT 69.6 (*) 29.5 - 28.4 %   MCV 93.5  78.0 - 100.0 fL   MCH 31.6  26.0 - 34.0 pg   MCHC  33.8  30.0 - 36.0 g/dL   RDW 91.4  78.2 - 95.6 %   Platelets 175  150 - 400 K/uL     Treatments: IV hydration and Procardia, IV pain medication, SQ Terbutaline  Discharge Exam: Blood pressure 116/57, pulse 108, temperature 97.9 F (36.6 C), temperature source Oral, resp. rate 20, height 5\' 3"  (1.6 m), weight 140 lb 9.6 oz (63.776 kg), last menstrual period 05/07/2012, SpO2 96.00%. General appearance: alert Resp: clear to auscultation bilaterally Pelvic: Cervix FT, 50%, posterior, vtx -2--unchanged from previous exams. Extremities: extremities normal, atraumatic, no cyanosis or edema  FHR Category 1 UCs irritability, but patient in NAD.  Disposition: 01-Home or Self Care   Future Appointments Provider Department Dept Phone   11/30/2012 2:30 PM [facility] [phone]   11/30/2012 3:00 PM [facility] [phone]   11/30/2012 3:45 PM Kirkland Hun, MD [facility]  Gynecology [phone]       Medication List    TAKE these medications       hydrOXYzine 50 MG tablet  Commonly known as:  ATARAX/VISTARIL  Take 1 tablet (50 mg total) by mouth 3 (three) times daily as needed for anxiety.     NIFEdipine 10 MG capsule  Commonly known as:  PROCARDIA  Take 1 capsule (10 mg total) by mouth every 4 (four) hours as needed (contractions).     prenatal multivitamin Tabs  Take 1 tablet by mouth daily.     sertraline 25 MG tablet  Commonly known as:  ZOLOFT  Take 1 tablet (25 mg total) by mouth daily. After 1 week, take 2 tab (50 mg total) by mouth daily.           Follow-up Information   Follow up with [facility] On 11/30/2012. (Keep scheduled appt at [facility] tomorrow for visit and ultrasound.  Call for any questions or concerns. )    Contact information:   [address] [place] [address] [phone]      Signed: Nigel Bridgeman 11/29/2012, 12:29 PM

## 2012-11-29 NOTE — Progress Notes (Signed)
Pt c/o chest heaviness and tightness, o2 sats wnl, lungs clear and intact, position changed for comfort, will call vicki to see pt

## 2012-11-29 NOTE — MAU Provider Note (Signed)
History     CSN: [id number]  Arrival date and time: 11/29/12 2207   First Provider Initiated Contact with Patient 11/29/12 2235      Chief Complaint  Patient presents with  . Contractions   HPI Comments: Pt is a [id number] at [redacted]w[redacted]d w c/o worsening ctx since being discharged this afternoon. Pt had a neg FFN, and some ?cervical change, had been FT then 2cm by another provider, she was observed on antenatal over the weekend, receiving procardia and 1 dose terbutaline.  She denies any VB, LOF, GFM.  Last took procardia at 2030     Past Medical History  Diagnosis Date  . UTI (urinary tract infection)   . Ectopic pregnancy   . Spinal headache     ? bad migraines following delivery  . Irregular heartbeat 2009    Hx of Echo done  . Preterm labor 2008    Late 2nd or Early 3rd trimester PTL, injection given to stop contractions, was d/c home w/ meds  . Preterm labor 2009    Preterm birth @ 32 wks; Abrupted placenta  . Preterm labor 2009    Prior to delivery of 2nd child;had contractions frequently  . Infection     BV;not frequent  . Infection     UTI;not frequent    Past Surgical History  Procedure Laterality Date  . No past surgeries      Family History  Problem Relation Age of Onset  . Hearing loss Neg Hx   . Other Mother     Charcot Byrd Hesselbach tooth  . Other Maternal Grandfather     Charcot Maria tooth  . Cancer Maternal Aunt     Breast;before menopause  . Cerebral palsy Sister     Twins;1/2 sisters    History  Substance Use Topics  . Smoking status: Never Smoker   . Smokeless tobacco: Never Used  . Alcohol Use: No    Allergies: No Known Allergies  Prescriptions prior to admission  Medication Sig Dispense Refill  . hydrOXYzine (ATARAX/VISTARIL) 50 MG tablet Take 1 tablet (50 mg total) by mouth 3 (three) times daily as needed for anxiety.  30 tablet  1  . NIFEdipine (PROCARDIA) 10 MG capsule Take 1 capsule (10 mg total) by mouth every 4 (four) hours as needed  (contractions).  30 capsule  2  . Prenatal Vit-Fe Fumarate-FA (PRENATAL MULTIVITAMIN) TABS Take 1 tablet by mouth daily.      . sertraline (ZOLOFT) 25 MG tablet Take 1 tablet (25 mg total) by mouth daily. After 1 week, take 2 tab (50 mg total) by mouth daily.  50 tablet  2    Review of Systems  Gastrointestinal: Positive for abdominal pain.       Cramping, ctx   All other systems reviewed and are negative.   Physical Exam   Blood pressure 115/63, pulse 112, temperature 98.1 F (36.7 C), temperature source Oral, resp. rate 18, last menstrual period 05/07/2012.  Physical Exam  Nursing note and vitals reviewed. Constitutional: She is oriented to person, place, and time. She appears well-developed and well-nourished.  HENT:  Head: Normocephalic.  Eyes: Pupils are equal, round, and reactive to light.  Neck: Normal range of motion.  Cardiovascular: Normal rate, regular rhythm and normal heart sounds.   Respiratory: Effort normal and breath sounds normal.  GI: Soft. Bowel sounds are normal. She exhibits no distension. There is no tenderness.  Genitourinary: Vagina normal.  Cx=FT/long/ballottable posterior, firm   Musculoskeletal: Normal range of motion.  Neurological: She is alert and oriented to person, place, and time. She has normal reflexes.  Skin: Skin is warm and dry.  Psychiatric: She has a normal mood and affect. Her behavior is normal.  Anxious    FHR reactive cat 1 toco quiet  MAU Course  Procedures     Assessment and Plan  IUP at [redacted]w[redacted]d Preterm ctx, without cervical change FFN from 2/6 neg GBS neg UA cx was neg  Will give vistaril 50mg  PO Percocet 2 tabs D/w Dr Evlyn Kanner M 11/29/2012, 10:49 PM    Addendum: at 1218am  Pt denies any pain, feels more relaxed now, wants to go home  FHR w occ mild variables, reassuring for GA, reactive toco - quiet VE - same FT/th/high/posterior/firm  dc'd home stable condition, rv'd PTL and FKC    S.Leeann Must, CNM

## 2012-11-29 NOTE — MAU Note (Signed)
Patient states she is having contractions back to back. Was discharge from the hospital earlier today. No vaginal bleeding or leaking of fluid.

## 2012-11-30 ENCOUNTER — Ambulatory Visit: Payer: Medicaid Other

## 2012-11-30 ENCOUNTER — Other Ambulatory Visit: Payer: BC Managed Care – PPO

## 2012-11-30 ENCOUNTER — Encounter: Payer: BC Managed Care – PPO | Admitting: Obstetrics and Gynecology

## 2012-11-30 ENCOUNTER — Ambulatory Visit: Payer: Medicaid Other | Admitting: Obstetrics and Gynecology

## 2012-11-30 VITALS — BP 110/60 | Wt 142.0 lb

## 2012-11-30 DIAGNOSIS — Z8751 Personal history of pre-term labor: Secondary | ICD-10-CM

## 2012-11-30 DIAGNOSIS — O321XX0 Maternal care for breech presentation, not applicable or unspecified: Secondary | ICD-10-CM | POA: Insufficient documentation

## 2012-11-30 DIAGNOSIS — O09219 Supervision of pregnancy with history of pre-term labor, unspecified trimester: Secondary | ICD-10-CM

## 2012-11-30 MED ORDER — HYDROXYPROGESTERONE CAPROATE 250 MG/ML IM OIL
250.0000 mg | TOPICAL_OIL | Freq: Once | INTRAMUSCULAR | Status: AC
  Administered 2012-11-30: 250 mg via INTRAMUSCULAR

## 2012-11-30 NOTE — Progress Notes (Signed)
[redacted]w[redacted]d Doing Well. 17 P today. 17 P weekly. Ultrasound: Single gestation, breech presentation, normal fluid, normal anatomy, cervix 3.38 cm, 3 lbs. 5 oz. (63rd percentile). Glucola equals 102.  Hemoglobin 9.7.  Platelet count 175,000.  RPR nonreactive. Return office in 1 week. Dr. Stefano Gaul

## 2012-11-30 NOTE — Progress Notes (Signed)
[redacted]w[redacted]d  Pt c/o: vaginal swelling  Pt wants cervix check today.

## 2012-12-07 ENCOUNTER — Encounter: Payer: Self-pay | Admitting: Obstetrics and Gynecology

## 2012-12-07 ENCOUNTER — Ambulatory Visit: Payer: Medicaid Other | Admitting: Obstetrics and Gynecology

## 2012-12-07 VITALS — BP 102/60 | Wt 138.0 lb

## 2012-12-07 DIAGNOSIS — O09219 Supervision of pregnancy with history of pre-term labor, unspecified trimester: Secondary | ICD-10-CM

## 2012-12-07 DIAGNOSIS — R300 Dysuria: Secondary | ICD-10-CM

## 2012-12-07 LAB — POCT URINALYSIS DIPSTICK
Blood, UA: NEGATIVE
Ketones, UA: NEGATIVE
Spec Grav, UA: 1.02
pH, UA: 7

## 2012-12-07 MED ORDER — HYDROXYPROGESTERONE CAPROATE 250 MG/ML IM OIL
250.0000 mg | TOPICAL_OIL | Freq: Once | INTRAMUSCULAR | Status: AC
  Administered 2012-12-07: 250 mg via INTRAMUSCULAR

## 2012-12-07 NOTE — Progress Notes (Signed)
Pt states she had large amount of mucous discharge on Friday, has experienced increased vaginal pressure.

## 2012-12-07 NOTE — Addendum Note (Signed)
Addended by: Stephens Shire on: 12/07/2012 05:12 PM   Modules accepted: Orders

## 2012-12-07 NOTE — Patient Instructions (Signed)

## 2012-12-07 NOTE — Progress Notes (Signed)
[redacted]w[redacted]d Physical Examination: Pelvic - normal external genitalia, vulva, vagina, cervix, uterus and adnexa, WET MOUNT done - results: negative for pathogens, normal epithelial cells 17 p today and q week [place] for growth @NV 

## 2012-12-10 LAB — US OB FOLLOW UP

## 2012-12-14 ENCOUNTER — Telehealth: Payer: Self-pay | Admitting: Obstetrics and Gynecology

## 2012-12-14 ENCOUNTER — Encounter: Payer: Self-pay | Admitting: Family Medicine

## 2012-12-14 ENCOUNTER — Observation Stay (HOSPITAL_COMMUNITY)
Admission: AD | Admit: 2012-12-14 | Discharge: 2012-12-15 | Disposition: A | Discharge status: Home / Self Care | Payer: Medicaid Other | Source: Ambulatory Visit | Attending: Obstetrics and Gynecology | Admitting: Obstetrics and Gynecology

## 2012-12-14 ENCOUNTER — Ambulatory Visit: Payer: Medicaid Other | Admitting: Family Medicine

## 2012-12-14 ENCOUNTER — Other Ambulatory Visit: Payer: Medicaid Other

## 2012-12-14 ENCOUNTER — Encounter (HOSPITAL_COMMUNITY): Payer: Self-pay | Admitting: *Deleted

## 2012-12-14 VITALS — BP 90/62 | Wt 137.0 lb

## 2012-12-14 DIAGNOSIS — Z23 Encounter for immunization: Secondary | ICD-10-CM

## 2012-12-14 DIAGNOSIS — R102 Pelvic and perineal pain: Secondary | ICD-10-CM

## 2012-12-14 DIAGNOSIS — O99340 Other mental disorders complicating pregnancy, unspecified trimester: Secondary | ICD-10-CM | POA: Insufficient documentation

## 2012-12-14 DIAGNOSIS — O99019 Anemia complicating pregnancy, unspecified trimester: Secondary | ICD-10-CM | POA: Insufficient documentation

## 2012-12-14 DIAGNOSIS — R079 Chest pain, unspecified: Secondary | ICD-10-CM | POA: Insufficient documentation

## 2012-12-14 DIAGNOSIS — F329 Major depressive disorder, single episode, unspecified: Secondary | ICD-10-CM | POA: Insufficient documentation

## 2012-12-14 DIAGNOSIS — Z331 Pregnant state, incidental: Secondary | ICD-10-CM

## 2012-12-14 DIAGNOSIS — N898 Other specified noninflammatory disorders of vagina: Secondary | ICD-10-CM

## 2012-12-14 DIAGNOSIS — O99891 Other specified diseases and conditions complicating pregnancy: Secondary | ICD-10-CM | POA: Insufficient documentation

## 2012-12-14 DIAGNOSIS — IMO0001 Reserved for inherently not codable concepts without codable children: Secondary | ICD-10-CM | POA: Insufficient documentation

## 2012-12-14 DIAGNOSIS — F3289 Other specified depressive episodes: Secondary | ICD-10-CM | POA: Insufficient documentation

## 2012-12-14 DIAGNOSIS — O479 False labor, unspecified: Secondary | ICD-10-CM

## 2012-12-14 DIAGNOSIS — O26899 Other specified pregnancy related conditions, unspecified trimester: Secondary | ICD-10-CM

## 2012-12-14 DIAGNOSIS — O4700 False labor before 37 completed weeks of gestation, unspecified trimester: Principal | ICD-10-CM | POA: Insufficient documentation

## 2012-12-14 DIAGNOSIS — D649 Anemia, unspecified: Secondary | ICD-10-CM | POA: Insufficient documentation

## 2012-12-14 DIAGNOSIS — O09213 Supervision of pregnancy with history of pre-term labor, third trimester: Secondary | ICD-10-CM

## 2012-12-14 LAB — URINALYSIS, ROUTINE W REFLEX MICROSCOPIC
Ketones, ur: 15 mg/dL — AB
Leukocytes, UA: NEGATIVE
Protein, ur: NEGATIVE mg/dL
Urobilinogen, UA: 0.2 mg/dL (ref 0.0–1.0)

## 2012-12-14 LAB — POCT URINALYSIS DIPSTICK
Bilirubin, UA: NEGATIVE
Blood, UA: NEGATIVE
Glucose, UA: NEGATIVE
Ketones, UA: NEGATIVE
Leukocytes, UA: NEGATIVE
Nitrite, UA: NEGATIVE
Protein, UA: NEGATIVE
Spec Grav, UA: <=1.005
Urobilinogen, UA: NEGATIVE
pH, UA: 5

## 2012-12-14 LAB — CBC
MCH: 31.4 pg (ref 26.0–34.0)
Platelets: 155 10*3/uL (ref 150–400)
RBC: 3.22 MIL/uL — ABNORMAL LOW (ref 3.87–5.11)
WBC: 10 10*3/uL (ref 4.0–10.5)

## 2012-12-14 LAB — FETAL FIBRONECTIN: Fetal Fibronectin: POSITIVE — AB

## 2012-12-14 MED ORDER — NIFEDIPINE 10 MG PO CAPS
10.0000 mg | ORAL_CAPSULE | Freq: Once | ORAL | Status: AC
  Administered 2012-12-14: 10 mg via ORAL
  Filled 2012-12-14: qty 1

## 2012-12-14 MED ORDER — MAGNESIUM SULFATE BOLUS VIA INFUSION
4.0000 g | Freq: Once | INTRAVENOUS | Status: DC
  Filled 2012-12-14: qty 500

## 2012-12-14 MED ORDER — BUTORPHANOL TARTRATE 1 MG/ML IJ SOLN
1.0000 mg | Freq: Once | INTRAMUSCULAR | Status: AC
  Administered 2012-12-14: 1 mg via INTRAMUSCULAR
  Filled 2012-12-14: qty 1

## 2012-12-14 MED ORDER — MAGNESIUM SULFATE 40 G IN LACTATED RINGERS - SIMPLE
2.0000 g/h | INTRAVENOUS | Status: DC
  Administered 2012-12-14: 8 g/h via INTRAVENOUS
  Filled 2012-12-14: qty 500

## 2012-12-14 MED ORDER — HYDROXYPROGESTERONE CAPROATE 250 MG/ML IM OIL
250.0000 mg | TOPICAL_OIL | Freq: Once | INTRAMUSCULAR | Status: AC
  Administered 2012-12-14: 250 mg via INTRAMUSCULAR

## 2012-12-14 MED ORDER — LACTATED RINGERS IV SOLN
INTRAVENOUS | Status: DC
  Administered 2012-12-14 (×2): via INTRAVENOUS

## 2012-12-14 MED ORDER — LACTATED RINGERS IV SOLN
INTRAVENOUS | Status: DC
  Administered 2012-12-14: 15:00:00 via INTRAVENOUS

## 2012-12-14 MED ORDER — HYDROCODONE-ACETAMINOPHEN 5-325 MG PO TABS
1.0000 | ORAL_TABLET | ORAL | Status: DC | PRN
  Administered 2012-12-15: 2 via ORAL
  Filled 2012-12-14: qty 2

## 2012-12-14 MED ORDER — LACTATED RINGERS IV SOLN
INTRAVENOUS | Status: DC
  Administered 2012-12-15: 01:00:00 via INTRAVENOUS

## 2012-12-14 MED ORDER — TERBUTALINE SULFATE 1 MG/ML IJ SOLN
0.2500 mg | Freq: Once | INTRAMUSCULAR | Status: AC
  Administered 2012-12-14: 0.25 mg via SUBCUTANEOUS

## 2012-12-14 MED ORDER — ZOLPIDEM TARTRATE 5 MG PO TABS
5.0000 mg | ORAL_TABLET | Freq: Every evening | ORAL | Status: DC | PRN
  Administered 2012-12-14: 5 mg via ORAL
  Filled 2012-12-14: qty 1

## 2012-12-14 MED ORDER — TERBUTALINE SULFATE 1 MG/ML IJ SOLN
0.2500 mg | Freq: Once | INTRAMUSCULAR | Status: DC
  Filled 2012-12-14: qty 1

## 2012-12-14 NOTE — MAU Note (Signed)
Patient states she was sent from the office for evaluation of pre term labor. States her contractions are "back to back".

## 2012-12-14 NOTE — MAU Note (Signed)
Patient is not in the lobby when called to triage.  

## 2012-12-14 NOTE — H&P (Signed)
HPI  26yo P2 at 31 4/7 wks presenting from office contracting q36min. She reports good FM, no vb, and LOF.  OB History    Grav  Para  Term  Preterm  Abortions  TAB  SAB  Ect  Mult  Living    4  2  1  1  1   0  0  1  0  2      Past Medical History   Diagnosis  Date   .  UTI (urinary tract infection)    .  Ectopic pregnancy    .  Spinal headache      ? bad migraines following delivery   .  Irregular heartbeat  2009     Hx of Echo done   .  Preterm labor  2008     Late 2nd or Early 3rd trimester PTL, injection given to stop contractions, was d/c home w/ meds   .  Preterm labor  2009     Preterm birth @ 32 wks; Abrupted placenta   .  Preterm labor  2009     Prior to delivery of 2nd child;had contractions frequently   .  Infection      BV;not frequent   .  Infection      UTI;not frequent    Past Surgical History   Procedure  Laterality  Date   .  No past surgeries      Family History   Problem  Relation  Age of Onset   .  Hearing loss  Neg Hx    .  Other  Mother      Charcot Byrd Hesselbach tooth   .  Other  Maternal Grandfather      Charcot Maria tooth   .  Cancer  Maternal Aunt      Breast;before menopause   .  Cerebral palsy  Sister      Twins;1/2 sisters    History   Substance Use Topics   .  Smoking status:  Never Smoker   .  Smokeless tobacco:  Never Used   .  Alcohol Use:  No    Allergies: No Known Allergies  Prescriptions prior to admission   Medication  Sig  Dispense  Refill   .  hydrOXYzine (ATARAX/VISTARIL) 50 MG tablet  Take 1 tablet (50 mg total) by mouth 3 (three) times daily as needed for anxiety.  30 tablet  1   .  NIFEdipine (PROCARDIA) 10 MG capsule  Take 1 capsule (10 mg total) by mouth every 4 (four) hours as needed (contractions).  30 capsule  2   .  Prenatal Vit-Fe Fumarate-FA (PRENATAL MULTIVITAMIN) TABS  Take 1 tablet by mouth daily.     .  sertraline (ZOLOFT) 25 MG tablet  Take 25 mg by mouth 2 (two) times daily. After 1 week, take 2 tab (50 mg total) by  mouth daily.     .  [DISCONTINUED] sertraline (ZOLOFT) 25 MG tablet  Take 1 tablet (25 mg total) by mouth daily. After 1 week, take 2 tab (50 mg total) by mouth daily.  50 tablet  2    ROS  Physical Exam   Blood pressure 110/70, pulse 108, temperature 97.7 F (36.5 C), temperature source Oral, resp. rate 16, height 5\' 3"  (1.6 m), weight 137 lb (62.143 kg), last menstrual period 05/07/2012, not currently breastfeeding.  Physical Exam  Lungs CTA bil  CV RRR  ABD gravid, NT  Ext no calf tenderness  VE FT/50%/-1  FHT 130s-140s, occas accels, no decels, moderate variability  TOCO q1-59min  MAU Course   Procedures  Bedside U/S - vertex  UA in office neg  UCx pending  FFN in office pos  Assessment and Plan   27yo P2 at 31 4/7wks undergoing eval for PTL. Procardia now. S/p BMZ course last week when admitted. IVF and will recheck. If unchanged, will likely d/c on standing dose of procardia for 48hrs then prn, Cont bedrest although pt really can't do bedrest because she has 2 small children at home.  Fetal status is overall reassuring.  Still contracting regularly and painful after 3 doses of procardia although she says they have decreased in intensity a little. Trial of [place] terb now for comfort and 1mg  stadol.  Adonis Ryther Y  12/14/2012, 1:15 PM   Pt still contracting regularly and feeling them.  Will admit for a short course of magnesium and observation to see if contractions will dissipate and reeval PTL.

## 2012-12-14 NOTE — MAU Note (Signed)
Jamie Wright, [employer] civil engineer given pt report, pt to go to room 152.

## 2012-12-14 NOTE — MAU Note (Signed)
Was hospitalized last week for preterm labor; was FT last week and is FT today; hx of preterm labor and preterm delivery at 32 weeks;

## 2012-12-14 NOTE — Progress Notes (Signed)
[redacted]w[redacted]d Pt complains of contractions every 5 min, pain and pressure in pelvic area.

## 2012-12-14 NOTE — MAU Note (Signed)
C/o ucs that started around 0700 this AM; sent from OB's office for further evaluation;

## 2012-12-14 NOTE — MAU Provider Note (Addendum)
History     CSN: [id number]  Arrival date and time: 12/14/12 1126   None     Chief Complaint  Patient presents with  . Labor Eval   HPI 26yo P2 at 31 4/7 wks presenting from office contracting q27min.  She reports good FM, no vb, and LOF.  OB History   Grav Para Term Preterm Abortions TAB SAB Ect Mult Living   4 2 1 1 1  0 0 1 0 2      Past Medical History  Diagnosis Date  . UTI (urinary tract infection)   . Ectopic pregnancy   . Spinal headache     ? bad migraines following delivery  . Irregular heartbeat 2009    Hx of Echo done  . Preterm labor 2008    Late 2nd or Early 3rd trimester PTL, injection given to stop contractions, was d/c home w/ meds  . Preterm labor 2009    Preterm birth @ 32 wks; Abrupted placenta  . Preterm labor 2009    Prior to delivery of 2nd child;had contractions frequently  . Infection     BV;not frequent  . Infection     UTI;not frequent    Past Surgical History  Procedure Laterality Date  . No past surgeries      Family History  Problem Relation Age of Onset  . Hearing loss Neg Hx   . Other Mother     Charcot Byrd Hesselbach tooth  . Other Maternal Grandfather     Charcot Maria tooth  . Cancer Maternal Aunt     Breast;before menopause  . Cerebral palsy Sister     Twins;1/2 sisters    History  Substance Use Topics  . Smoking status: Never Smoker   . Smokeless tobacco: Never Used  . Alcohol Use: No    Allergies: No Known Allergies  Prescriptions prior to admission  Medication Sig Dispense Refill  . hydrOXYzine (ATARAX/VISTARIL) 50 MG tablet Take 1 tablet (50 mg total) by mouth 3 (three) times daily as needed for anxiety.  30 tablet  1  . NIFEdipine (PROCARDIA) 10 MG capsule Take 1 capsule (10 mg total) by mouth every 4 (four) hours as needed (contractions).  30 capsule  2  . Prenatal Vit-Fe Fumarate-FA (PRENATAL MULTIVITAMIN) TABS Take 1 tablet by mouth daily.      . sertraline (ZOLOFT) 25 MG tablet Take 25 mg by mouth 2 (two)  times daily. After 1 week, take 2 tab (50 mg total) by mouth daily.      . [DISCONTINUED] sertraline (ZOLOFT) 25 MG tablet Take 1 tablet (25 mg total) by mouth daily. After 1 week, take 2 tab (50 mg total) by mouth daily.  50 tablet  2    ROS Physical Exam   Blood pressure 110/70, pulse 108, temperature 97.7 F (36.5 C), temperature source Oral, resp. rate 16, height 5\' 3"  (1.6 m), weight 137 lb (62.143 kg), last menstrual period 05/07/2012, not currently breastfeeding.  Physical Exam  Lungs CTA bil CV RRR ABD gravid, NT Ext no calf tenderness VE FT/50%/-1 FHT 130s-140s, occas accels, no decels, moderate variability TOCO q1-47min   MAU Course  Procedures Bedside U/S - vertex UA in office neg UCx pending FFN in office pos   Assessment and Plan  26yo P2 at 31 4/7wks undergoing eval for PTL.  Procardia now.  S/p BMZ course last week when admitted.  IVF and will recheck.  If unchanged, will likely d/c on standing dose of procardia for 48hrs then  prn, Cont bedrest although pt really can't do bedrest because she has 2 small children at home. Fetal status is overall reassuring.  Still contracting regularly and painful after 3 doses of procardia although she says they have decreased in intensity a little.  Trial of [place] terb now for comfort and 1mg  stadol.  Maudean Hoffmann Y 12/14/2012, 1:15 PM

## 2012-12-14 NOTE — Plan of Care (Signed)
Problem: Consults Goal: Birthing Suites Patient Information Press F2 to bring up selections list   Pt < [redacted] weeks EGA     

## 2012-12-14 NOTE — Progress Notes (Signed)
[redacted]w[redacted]d S: Pt. Presents with contractions at home q 5 mins x 2 days, cramps starts around the top of abdomen radiates to lower abdomen and increases in intensity to sharp with vaginal pressure.  States taking procardia x 1 dose (2/22 + 2/23), and resting.  Symptoms improves, but pressure remains unchanged.  Contractions worse this am verse yesterday. Husband was home and left this morning.  Denies intercourse.  No VB, but continued watery discharge.  Mood stable and medications working well for her.     O: Patient sitting on exam table in NAD.  Pelvic exam WNL except large amount of clear discharge.  PH 5.0, wet mount negative for trich, yeast, bv.  No palpable      contractions.  A: 1. Pregnancy (Breech)     2. Cramping vs. PTL  P: 1. ROB for U/S (growth) as scheduled.      2. 17-P today, continue q Monday.  NST: FHT 135 with 10 x10 accels, contracts q 1-2 minutes, 40-70 secs. Palpates mild-moderate.           Discussed with Dr. Stefano Gaul and will sent over to MAU for observation.  Dr. Su Hilt notified.       3. ROB in 2 weeks.

## 2012-12-14 NOTE — Telephone Encounter (Signed)
Pt calling has h/o pre-term labor states that she was having cx last night 2-3 minutes apart. States that they eased off last night. Started again this morning. 5 minutes apart, beginning to intensify. Pt states that she was admitted to the hosp 2 weeks ago and was there for 4 days. Was given procardia which she has been taking. Pt denies any bleeding or unusual discharge.  Apt scheduled for pt to come into office this morning to see LC, NP.  Darien Ramus, CMA

## 2012-12-15 LAB — URINE CULTURE

## 2012-12-15 LAB — AMNISURE RUPTURE OF MEMBRANE (ROM) NOT AT ARMC: Amnisure ROM: NEGATIVE

## 2012-12-15 LAB — MAGNESIUM: Magnesium: 4.7 mg/dL — ABNORMAL HIGH (ref 1.5–2.5)

## 2012-12-15 NOTE — Discharge Summary (Signed)
Discharge Summary for [facility] Antenatal Obstetrical Patient  Jamie Wright  DOB:    Dec 10, 1985 MRN:    [id number] CSN:    [id number]  Date of admission:                  12/14/2012  Date of discharge:                   12/15/2012  Admission Diagnosis:  [redacted]w[redacted]d  Preterm uterine contractions  History of preterm labor  Positive fetal fibronectin  Depression  Anemia  Discharge Diagnosis:  31 weeks and 5 days  Resolved preterm uterine contractions  History of preterm labor  Positive fetal fibronectin  Depression  Anemia  Procedures this admission:  None  History of Present Illness:  Ms. Jamie Wright is a 27 y.o. female, [id number], who presents at [redacted]w[redacted]d gestation. The patient has been followed at the [facility] of [facility]. She was admitted for  Preterm uterine contractions. Her pregnancy has been complicated by: A history of a prior preterm delivery following preterm labor at [redacted] weeks gestation. She also has a history of depression and anemia. She was evaluated one week ago and was found to have a positive fetal fibronectin.  She was given 2 doses of betamethasone. The patient was seen in the office on the day of admission and was noted to have contractions every 2-3 minutes. She was sent to the hospital for further evaluation and treatment.  CBC    Component Value Date/Time   WBC 10.0 12/14/2012 1758   RBC 3.22* 12/14/2012 1758   HGB 10.1* 12/14/2012 1758   HCT 29.5* 12/14/2012 1758   PLT 155 12/14/2012 1758   MCV 91.6 12/14/2012 1758   MCH 31.4 12/14/2012 1758   MCHC 34.2 12/14/2012 1758   RDW 13.5 12/14/2012 1758   LYMPHSABS 1.2 07/20/2012 1533   MONOABS 0.5 07/20/2012 1533   EOSABS 0.1 07/20/2012 1533   BASOSABS 0.0 07/20/2012 1533      Hospital course:  The patient was admitted for management of preterm contractions.   She was given 3 doses of Procardia, subcutaneous terbutaline, and  an IV fluid bolus. She continued to have frequent contractions. The patient was then admitted to the hospital and given IV magnesium. The IV magnesium was discontinued at approximately 5:00 in the morning do to "feeling bad". Her evaluation was normal. The contractions spaced out on the magnesium. Once the magnesium was discontinued the patient remained comfortable in the contractions did not return. Her cervix was unchanged at fingertip dilated, 40% effaced, and ballotable in station. She was discharged to home doing well.  BP 110/78  Pulse 93  Temp(Src) 98.2 F (36.8 C) (Oral)  Resp 20  Ht 5\' 3"  (1.6 m)  Wt 137 lb (62.143 kg)  BMI 24.27 kg/m2  SpO2 99%  LMP 05/07/2012  Breastfeeding? No   Discharge Information:  Activity:           pelvic rest and Limited ambulation Diet:                routine Medications: Procardia, Zoloft, and Tylenol as needed Condition:      stable and improved Instructions:  Preterm labor Discharge to: home  Follow-up Information   Follow up with [facility]. Schedule an appointment as soon as possible for a visit in 3 days.   Contact information:   [address], [address] [place] [address]  [name] 12/15/2012

## 2012-12-15 NOTE — Progress Notes (Signed)
Pt crying and complaining continues to c/o chest tightness pain and myalgia all over. Requesting that magnesium be turned off. Pt expressed understanding the purpose of med. Pt becoming more tearful and insisting that magnesium be turned off. Mag off per pts request. Pt up to bathroom with normal gait assisted by RN.

## 2012-12-15 NOTE — Progress Notes (Signed)
Patient requested Mag be turned off at 5 40am, due to "feeling bad" All VS were WNL, mag level 4.7, O2 sat WNL. Magnesium turned off at 5:45. Irregular, mild contractions since then. Patient received Vicodin po, and went to sleep. Amnisure negative. Dr. Su Hilt updated. Anticipate d/c home today.  Nigel Bridgeman, CNM 12/15/12 7:10a

## 2012-12-15 NOTE — Progress Notes (Signed)
Hospital day # 1 pregnancy at [redacted]w[redacted]d--admitted 12/14/12 for PTL, on Magnesium Sulfate.  S:   Called to see patient due to "feeling bad"--was sleeping soundly, then awakened to go to bathroom.  Thought she might be leaking.  Also talked to husband on the phone and was tearful.  Feeling nauseated unless she sits up, now feeling "tight in my chest".  Denies chest pain or cough.  No HA, visual sx, or epigastric pain.  Denies reflux, no asthma symptoms or history.  Reports nasal congestion due to crying.  Also c/o low back pain.  Took Ambien for sleep with benefit.  Had increased contractions earlier in the evening, now resolved.   O: BP 84/37  Pulse 95  Temp(Src) 97.9 F (36.6 C) (Oral)  Resp 20  Ht 5\' 3"  (1.6 m)  Wt 137 lb (62.143 kg)  BMI 24.27 kg/m2  SpO2 99%  LMP 05/07/2012  Breastfeeding? No      Fetal tracings:  Category 1      Contractions:   Irritability      Uterus non-tender      Extremities: no significant edema and no signs of DVT.  DTR 2-3+, no clonus, no edema.       Chest clear       Heart RRR without murmur--pulse 106,  I/O = +891.2 last 24 hours, with 1750 cc output since 7pm.          A: [redacted]w[redacted]d with PTL--on Magnesium 2 gm/hr          P:  Updated Dr. Su Hilt on status--will update again prn results.       Will check mag level       O2 per [place] for patient comfort       Amnisure stat       Vicodin prn        Nigel Bridgeman CNM, [place] 12/15/2012 4:46 AM

## 2012-12-15 NOTE — Progress Notes (Signed)
27 y.o. year old female,at [redacted]w[redacted]d gestation. Magn has been stopped.  SUBJECTIVE:  Feels better. No contractions.  OBJECTIVE:  BP 84/37  Pulse 92  Temp(Src) 97.9 F (36.6 C) (Oral)  Resp 20  Ht 5\' 3"  (1.6 m)  Wt 137 lb (62.143 kg)  BMI 24.27 kg/m2  SpO2 99%  LMP 05/07/2012  Breastfeeding? No  Fetal Heart Tones:  Cat 1  Contractions:          few  Chest: Clear Heart: RRR Fundus: Nontender Cx: FT/40%/ballotable  ASSESSMENT:  [redacted]w[redacted]d Weeks Pregnancy  Resolved Preterm Contractions  Hx of Preterm Labor  PLAN:  Discharge to home.  Procardia as needed.  RTO in 3 days.  Call for problems.  Leonard Schwartz, M.D.

## 2012-12-16 ENCOUNTER — Telehealth: Payer: Self-pay | Admitting: Obstetrics and Gynecology

## 2012-12-16 ENCOUNTER — Inpatient Hospital Stay (HOSPITAL_COMMUNITY): Payer: Medicaid Other

## 2012-12-16 ENCOUNTER — Encounter (HOSPITAL_COMMUNITY): Payer: Self-pay | Admitting: *Deleted

## 2012-12-16 ENCOUNTER — Inpatient Hospital Stay (HOSPITAL_COMMUNITY)
Admission: AD | Admit: 2012-12-16 | Discharge: 2012-12-16 | Disposition: A | Discharge status: Home / Self Care | Payer: Medicaid Other | Source: Ambulatory Visit | Attending: Obstetrics and Gynecology | Admitting: Obstetrics and Gynecology

## 2012-12-16 DIAGNOSIS — O4700 False labor before 37 completed weeks of gestation, unspecified trimester: Secondary | ICD-10-CM | POA: Insufficient documentation

## 2012-12-16 DIAGNOSIS — Z23 Encounter for immunization: Secondary | ICD-10-CM

## 2012-12-16 LAB — URINALYSIS, ROUTINE W REFLEX MICROSCOPIC
Bilirubin Urine: NEGATIVE
Leukocytes, UA: NEGATIVE
Nitrite: NEGATIVE
Specific Gravity, Urine: 1.02 (ref 1.005–1.030)
pH: 6.5 (ref 5.0–8.0)

## 2012-12-16 LAB — COMPREHENSIVE METABOLIC PANEL
ALT: 9 U/L (ref 0–35)
Alkaline Phosphatase: 132 U/L — ABNORMAL HIGH (ref 39–117)
CO2: 20 mEq/L (ref 19–32)
GFR calc Af Amer: >90 mL/min (ref >90)
GFR calc non Af Amer: >90 mL/min (ref >90)
Glucose, Bld: 78 mg/dL (ref 70–99)
Potassium: 3.6 mEq/L (ref 3.5–5.1)
Sodium: 135 mEq/L (ref 135–145)

## 2012-12-16 LAB — WET PREP, GENITAL: Trich, Wet Prep: NONE SEEN

## 2012-12-16 LAB — CBC
Hemoglobin: 12 g/dL (ref 12.0–15.0)
RBC: 3.84 MIL/uL — ABNORMAL LOW (ref 3.87–5.11)

## 2012-12-16 MED ORDER — NIFEDIPINE 10 MG PO CAPS
10.0000 mg | ORAL_CAPSULE | ORAL | Status: AC
  Administered 2012-12-16 (×3): 10 mg via ORAL
  Filled 2012-12-16: qty 1
  Filled 2012-12-16: qty 2

## 2012-12-16 NOTE — MAU Provider Note (Signed)
History     CSN: [id number]  Arrival date and time: 12/16/12 1602   First Provider Initiated Contact with Patient 12/16/12 1649      Chief Complaint  Patient presents with  . Labor Eval   HPI Comments: Pt is a [id number] at [redacted]w[redacted]d that arrives unannounced w c/o ctx that have been going on all day, she was discharged from antepartum yesterday for preterm ctx, she states she's taken procardia as prescribed, last took at 1330. She denies any VB or LOF, she reports +FM. She also c/o increased vaginal pressure. She denies any VB, LOF, reports GFM. She rcv'd BMZ course on 2/6 and 2/7, she was also admitted overnight at that time, as well.  She rcv'd 3 doses of procardia, w some spacing out of ctx, but frequent irritability remained, tho pt states they were not painful, she just "noticed them"      Past Medical History  Diagnosis Date  . UTI (urinary tract infection)   . Ectopic pregnancy   . Spinal headache     ? bad migraines following delivery  . Irregular heartbeat 2009    Hx of Echo done  . Preterm labor 2008    Late 2nd or Early 3rd trimester PTL, injection given to stop contractions, was d/c home w/ meds  . Preterm labor 2009    Preterm birth @ 32 wks; Abrupted placenta  . Preterm labor 2009    Prior to delivery of 2nd child;had contractions frequently  . Infection     BV;not frequent  . Infection     UTI;not frequent    Past Surgical History  Procedure Laterality Date  . No past surgeries      Family History  Problem Relation Age of Onset  . Hearing loss Neg Hx   . Other Mother     Charcot Byrd Hesselbach tooth  . Other Maternal Grandfather     Charcot Maria tooth  . Cancer Maternal Aunt     Breast;before menopause  . Cerebral palsy Sister     Twins;1/2 sisters    History  Substance Use Topics  . Smoking status: Never Smoker   . Smokeless tobacco: Never Used  . Alcohol Use: No    Allergies: No Known Allergies  Prescriptions prior to admission  Medication Sig  Dispense Refill  . hydrOXYzine (ATARAX/VISTARIL) 50 MG tablet Take 50 mg by mouth daily as needed for anxiety.      Marland Kitchen NIFEdipine (PROCARDIA) 10 MG capsule Take 1 capsule (10 mg total) by mouth every 4 (four) hours as needed (contractions).  30 capsule  2  . Prenatal Vit-Fe Fumarate-FA (PRENATAL MULTIVITAMIN) TABS Take 1 tablet by mouth daily.      . sertraline (ZOLOFT) 25 MG tablet Take 25 mg by mouth 2 (two) times daily. After 1 week, take 2 tab (50 mg total) by mouth daily.      . [DISCONTINUED] hydrOXYzine (ATARAX/VISTARIL) 50 MG tablet Take 1 tablet (50 mg total) by mouth 3 (three) times daily as needed for anxiety.  30 tablet  1    Review of Systems  All other systems reviewed and are negative.   Physical Exam   Blood pressure 100/74, pulse 95, temperature 97.8 F (36.6 C), temperature source Oral, resp. rate 16, height 5' 2.5" (1.588 m), weight 138 lb 12.8 oz (62.959 kg), last menstrual period 05/07/2012, SpO2 99.00%.  Physical Exam  Nursing note and vitals reviewed. Constitutional: She is oriented to person, place, and time. She appears well-developed and well-nourished.  Cardiovascular: Normal rate.   Respiratory: Effort normal.  GI: Soft. There is no tenderness.  Genitourinary: Vagina normal.  VE=1/50/-2 ballottable   Musculoskeletal: Normal range of motion.  Neurological: She is alert and oriented to person, place, and time. She has normal reflexes.  Skin: Skin is warm and dry.  Psychiatric: She has a normal mood and affect. Her behavior is normal.   FHR 130 reactive cat 1 toco frequent every 1 min ctx initially, then spaced out after returning [place], appeared more like frequent irritability  MAU Course  Procedures    Assessment and Plan  IUP at [redacted]w[redacted]d Hx +FFN Has rcv'd BMZ course Wet prep neg GC/CT pending UA neg No cervical change  Will d/c home w PTL precautions, continue procardia ATC every 4 hours for the next 24 hours, then every 4-6 as needed Keep office  appointment  D/w Dr Imagene Gurney 12/16/2012, 9:15 PM

## 2012-12-16 NOTE — Telephone Encounter (Signed)
TC to pt. States is continuing to have pressure as if she has to push.  Having back to back contractions even after taking Procardia at 9:00am and 1:30. +FM Per SL, pt to MAU now. Pt verbalizes comprehension.

## 2012-12-16 NOTE — MAU Note (Signed)
Patient states she is having "back to back" contractions with the urge to push. Denies any bleeding or leaking and baby moving good.

## 2012-12-17 LAB — GC/CHLAMYDIA PROBE AMP: GC Probe RNA: NEGATIVE

## 2012-12-18 ENCOUNTER — Encounter: Payer: Self-pay | Admitting: Obstetrics and Gynecology

## 2012-12-18 ENCOUNTER — Ambulatory Visit: Payer: Medicaid Other | Admitting: Obstetrics and Gynecology

## 2012-12-18 VITALS — BP 98/60 | Wt 139.0 lb

## 2012-12-18 DIAGNOSIS — O479 False labor, unspecified: Secondary | ICD-10-CM

## 2012-12-18 NOTE — Patient Instructions (Signed)

## 2012-12-18 NOTE — Progress Notes (Signed)
Pt was seen at hospital 12/16/12, given procardia but still c/o regular contractions.

## 2012-12-18 NOTE — Progress Notes (Signed)
[redacted]w[redacted]d Pt c/o ctx about 6-10 per hour Good Fm cx unchanged \\pt  declined NST RT 1 week for [place] and 17 P PTL precautions given

## 2012-12-20 ENCOUNTER — Encounter (HOSPITAL_COMMUNITY): Payer: Self-pay | Admitting: *Deleted

## 2012-12-20 ENCOUNTER — Inpatient Hospital Stay (HOSPITAL_COMMUNITY)
Admission: AD | Admit: 2012-12-20 | Discharge: 2012-12-20 | Disposition: A | Discharge status: Home / Self Care | Payer: Medicaid Other | Source: Ambulatory Visit | Attending: Obstetrics and Gynecology | Admitting: Obstetrics and Gynecology

## 2012-12-20 DIAGNOSIS — Z23 Encounter for immunization: Secondary | ICD-10-CM

## 2012-12-20 DIAGNOSIS — O368131 Decreased fetal movements, third trimester, fetus 1: Secondary | ICD-10-CM

## 2012-12-20 DIAGNOSIS — O368190 Decreased fetal movements, unspecified trimester, not applicable or unspecified: Secondary | ICD-10-CM | POA: Insufficient documentation

## 2012-12-20 NOTE — MAU Note (Signed)
Pt stated she had not felt baby move since yesterday. Has eatenn and tried kick count no movement felt. brought pt straight back to room and EFM place . Fetal HR 140.

## 2012-12-20 NOTE — MAU Provider Note (Signed)
History   Jamie Wright is a 26y.o. WF at [redacted]w[redacted]d who presents w/ CC of decreased movement.  States she hasn't felt normal movement since Friday.  Had her baby shower yesterday and doesn't recall feeling movement yesterday.  Reports fetus usually active at night, and didn't feel movement last night.  No VB or LOF.  No fever or chills.  No ctxs or abdominal pain.  Pt had growth u/s 12/16/12 and EFW=51%; cx=3.3cm (2.4cm w/ valsalva); AFI also normal.  FFN pos on 12/16/12. No UTI or PIH s/s. Pt called to report lack of movement around 0745 and stated she had eaten some "chocolate."  Asked pt to eat some protein and drink cold fluid and come to MAU for eval at that time. .. Patient Active Problem List  Diagnosis  . Pregnant state, incidental  . Prior pregnancy with placenta abruption, antepartum  . History of ectopic pregnancy  . Preterm labor second trimester with preterm delivery third trimester  . Preterm delivery  . Flu vaccine need  . Anomaly of umbilical cord  . Preterm labor  . Breech presentation    CSN: [id number]  Arrival date and time: 12/20/12 7829   None     Chief Complaint  Patient presents with  . Decreased Fetal Movement   HPI  OB History   Grav Para Term Preterm Abortions TAB SAB Ect Mult Living   4 2 1 1 1  0 0 1 0 2      Past Medical History  Diagnosis Date  . UTI (urinary tract infection)   . Ectopic pregnancy   . Spinal headache     ? bad migraines following delivery  . Irregular heartbeat 2009    Hx of Echo done  . Preterm labor 2008    Late 2nd or Early 3rd trimester PTL, injection given to stop contractions, was d/c home w/ meds  . Preterm labor 2009    Preterm birth @ 32 wks; Abrupted placenta  . Preterm labor 2009    Prior to delivery of 2nd child;had contractions frequently  . Infection     BV;not frequent  . Infection     UTI;not frequent    Past Surgical History  Procedure Laterality Date  . No past surgeries      Family History   Problem Relation Age of Onset  . Hearing loss Neg Hx   . Other Mother     Charcot Byrd Hesselbach tooth  . Other Maternal Grandfather     Charcot Maria tooth  . Cancer Maternal Aunt     Breast;before menopause  . Cerebral palsy Sister     Twins;1/2 sisters    History  Substance Use Topics  . Smoking status: Never Smoker   . Smokeless tobacco: Never Used  . Alcohol Use: No    Allergies: No Known Allergies  Prescriptions prior to admission  Medication Sig Dispense Refill  . NIFEdipine (PROCARDIA) 10 MG capsule Take 1 capsule (10 mg total) by mouth every 4 (four) hours as needed (contractions).  30 capsule  2  . Prenatal Vit-Fe Fumarate-FA (PRENATAL MULTIVITAMIN) TABS Take 1 tablet by mouth daily.      . sertraline (ZOLOFT) 25 MG tablet Take 50 mg by mouth 2 (two) times daily.         ROS--see HPI Physical Exam   Blood pressure 107/72, pulse 114, temperature 97 F (36.1 C), temperature source Oral, resp. rate 18, height 5' 2.5" (1.588 m), weight 139 lb 9.6 oz (63.322 kg), last menstrual  period 05/07/2012.  Physical Exam  Constitutional: She is oriented to person, place, and time. She appears well-developed and well-nourished. No distress.  HENT:  Head: Normocephalic and atraumatic.  Eyes: Pupils are equal, round, and reactive to light.  Cardiovascular: Normal rate.   Respiratory: Effort normal.  GI: Soft.  gravid  Genitourinary:  Pelvic exam deferred  Neurological: She is alert and oriented to person, place, and time.  Skin: Skin is warm and dry.  Psychiatric: She has a normal mood and affect. Her behavior is normal. Judgment and thought content normal.    MAU Course  Procedures 1. NST--135, reactive, moderate variability, no decels; TOCO; rare ctx  Assessment and Plan  1. [redacted]w[redacted]d 2. Decreased FM w/ reactive NST and palpable fetal movement while monitored 3. No s/s of PTL; pos FFN 12/16/12 4. Normal OB u/s 12/16/12  1. D/c home w/ [facility] and PTL precautions; f/u this Wed  12/23/12 for office appt, or prn  STEELMAN,CANDICE H 12/20/2012, 9:22 AM

## 2012-12-21 ENCOUNTER — Other Ambulatory Visit: Payer: Medicaid Other

## 2012-12-22 ENCOUNTER — Telehealth (HOSPITAL_COMMUNITY): Payer: Self-pay | Admitting: Obstetrics and Gynecology

## 2012-12-22 NOTE — Telephone Encounter (Signed)
TC from patient--UCs still q 3 minutes, but not as strong. Advised patient I cannot in good conscience recommend she stay at home in light of persistent UCs and +FFN 12/14/12. Patient will come to MAU.

## 2012-12-22 NOTE — Telephone Encounter (Signed)
TC from patient--33 weeks, G4P2, hx +FFN 2/24, received BMZ. Contracting all day, now stronger and back to back--took Procardia 10 mg at 7pm (on q 4 hours). Does not want to come to MAU.  Issue reviewed.  Cannot recommend against evaluation in MAU. Patient will take Procardia 20 mg now, push po fluids, and call back in 1 hour with update. If UCs persist, needs evaluation.

## 2012-12-23 ENCOUNTER — Other Ambulatory Visit: Payer: Medicaid Other

## 2012-12-23 ENCOUNTER — Other Ambulatory Visit: Payer: Self-pay | Admitting: Obstetrics and Gynecology

## 2012-12-23 ENCOUNTER — Encounter: Payer: Self-pay | Admitting: Obstetrics and Gynecology

## 2012-12-23 ENCOUNTER — Ambulatory Visit: Payer: Medicaid Other

## 2012-12-23 ENCOUNTER — Ambulatory Visit: Payer: Medicaid Other | Admitting: Obstetrics and Gynecology

## 2012-12-23 VITALS — BP 92/56 | Wt 138.0 lb

## 2012-12-23 DIAGNOSIS — O09213 Supervision of pregnancy with history of pre-term labor, third trimester: Secondary | ICD-10-CM

## 2012-12-23 DIAGNOSIS — Z331 Pregnant state, incidental: Secondary | ICD-10-CM

## 2012-12-23 DIAGNOSIS — R300 Dysuria: Secondary | ICD-10-CM

## 2012-12-23 MED ORDER — HYDROXYPROGESTERONE CAPROATE 250 MG/ML IM OIL
250.0000 mg | TOPICAL_OIL | Freq: Once | INTRAMUSCULAR | Status: AC
  Administered 2012-12-23: 250 mg via INTRAMUSCULAR

## 2012-12-23 NOTE — Progress Notes (Signed)
[redacted]w[redacted]d  GFM Using Procardia but still having lots of contractions and pressure No bleeding no LOF

## 2012-12-23 NOTE — Progress Notes (Signed)
[redacted]w[redacted]d 17 P Given Ultrasound shows:  SIUP  S=D     [place] EDD: 02/11/2013           EFW: 2063 grams / 4.5 lbs =  52nd %           AFI: 19.0 = 70th%           Cervical length:  Not measured           Placenta localization: anterior           Fetal presentation: vertex Comments: no previa. Normal placental color flow normal placental cord insertion. Nor fluid. Normal growth. Cx closed. No funneling

## 2012-12-23 NOTE — Progress Notes (Signed)
[redacted]w[redacted]d Pt has been contracting since last pm and they were 1 min then 30 min  Growth U/S today 17 P today

## 2012-12-27 ENCOUNTER — Other Ambulatory Visit: Payer: Self-pay

## 2012-12-28 ENCOUNTER — Inpatient Hospital Stay (HOSPITAL_COMMUNITY)
Admission: AD | Admit: 2012-12-28 | Discharge: 2012-12-28 | Disposition: A | Discharge status: Home / Self Care | Payer: Medicaid Other | Source: Ambulatory Visit | Attending: Obstetrics and Gynecology | Admitting: Obstetrics and Gynecology

## 2012-12-28 ENCOUNTER — Telehealth: Payer: Self-pay

## 2012-12-28 ENCOUNTER — Telehealth: Payer: Self-pay | Admitting: Obstetrics and Gynecology

## 2012-12-28 DIAGNOSIS — O99891 Other specified diseases and conditions complicating pregnancy: Secondary | ICD-10-CM | POA: Insufficient documentation

## 2012-12-28 DIAGNOSIS — R109 Unspecified abdominal pain: Secondary | ICD-10-CM | POA: Insufficient documentation

## 2012-12-28 DIAGNOSIS — Z23 Encounter for immunization: Secondary | ICD-10-CM

## 2012-12-28 DIAGNOSIS — R197 Diarrhea, unspecified: Secondary | ICD-10-CM | POA: Insufficient documentation

## 2012-12-28 LAB — URINALYSIS, ROUTINE W REFLEX MICROSCOPIC
Glucose, UA: NEGATIVE mg/dL
Ketones, ur: 15 mg/dL — AB
Leukocytes, UA: NEGATIVE
Specific Gravity, Urine: 1.02 (ref 1.005–1.030)
pH: 6 (ref 5.0–8.0)

## 2012-12-28 MED ORDER — DIPHENOXYLATE-ATROPINE 2.5-0.025 MG PO TABS: 1.0000 | ORAL_TABLET | Freq: Once | ORAL | Status: DC

## 2012-12-28 MED ORDER — NIFEDIPINE 10 MG PO CAPS
10.0000 mg | ORAL_CAPSULE | Freq: Once | ORAL | Status: AC
  Administered 2012-12-28: 10 mg via ORAL
  Filled 2012-12-28: qty 1

## 2012-12-28 MED ORDER — DIPHENOXYLATE-ATROPINE 2.5-0.025 MG PO TABS
2.0000 | ORAL_TABLET | Freq: Once | ORAL | Status: AC
  Administered 2012-12-28: 2 via ORAL
  Filled 2012-12-28: qty 2

## 2012-12-28 NOTE — Telephone Encounter (Signed)
Took emergency call from pt who states she has had diarrhea x 3 days. She thinks she ate something that didn't agree with her. Pt states that she had ONE occurrence of an increased watery d/c that she thought may have been urine. This has not happened more than once. Pt reports good FM, no bleeding and no ctx. She has some cramping associated with the diarrhea, but states she feels fine otherwise. I recommended the BRAT diet, and to try either Immodium or Kaopectate, continue to drink plenty of fluids, and I asked her to do the pad test and I will call her in 1 1/2 hrs to check on the results of that. Pt understands. Melody Comas A

## 2012-12-28 NOTE — MAU Note (Signed)
Patient states she is having contractions every 3 minutes. Denies bleeding or leaking and reports good fetal movement.  

## 2012-12-28 NOTE — Telephone Encounter (Signed)
Followed up with pt to see what the results of her pad test was. She reports a small amt of normal discharge. Nothing significant. She has eaten a banana, and plans to go out soon to get OTC med to treat diarrhea sx's. She will observe sx's and report anything concerning, and certainly worsening or new sx's. Pt understands. Melody Comas A

## 2012-12-29 LAB — US OB FOLLOW UP

## 2012-12-30 ENCOUNTER — Other Ambulatory Visit: Payer: Medicaid Other

## 2012-12-30 NOTE — MAU Provider Note (Signed)
  History     CSN: [id number]  Arrival date and time: 12/28/12 1803   None     Chief Complaint  Patient presents with  . Labor Eval   HPI Comments: Pt is a [id number] at [redacted]w[redacted]d arrives w c/o cramping/ctx and diarrhea the last 3 days. Denies any VB or LOF, reports GFM. Has not taken any meds for diarrhea, denies any other sx's no N/V or respiratory sx's, no UTI sx's. Has not been around anyone else with illness.       Past Medical History  Diagnosis Date  . UTI (urinary tract infection)   . Ectopic pregnancy   . Spinal headache     ? bad migraines following delivery  . Irregular heartbeat 2009    Hx of Echo done  . Preterm labor 2008    Late 2nd or Early 3rd trimester PTL, injection given to stop contractions, was d/c home w/ meds  . Preterm labor 2009    Preterm birth @ 32 wks; Abrupted placenta  . Preterm labor 2009    Prior to delivery of 2nd child;had contractions frequently  . Infection     BV;not frequent  . Infection     UTI;not frequent    Past Surgical History  Procedure Laterality Date  . No past surgeries      Family History  Problem Relation Age of Onset  . Hearing loss Neg Hx   . Other Mother     Charcot Byrd Hesselbach tooth  . Other Maternal Grandfather     Charcot Maria tooth  . Cancer Maternal Aunt     Breast;before menopause  . Cerebral palsy Sister     Twins;1/2 sisters    History  Substance Use Topics  . Smoking status: Never Smoker   . Smokeless tobacco: Never Used  . Alcohol Use: No    Allergies: No Known Allergies  No prescriptions prior to admission    Review of Systems  Constitutional: Negative for fever.  HENT: Negative for congestion.   Respiratory: Negative for cough.   Gastrointestinal: Positive for diarrhea. Negative for nausea and vomiting.  Genitourinary: Negative for dysuria.  Musculoskeletal: Negative for myalgias.  All other systems reviewed and are negative.   Physical Exam   Blood pressure 102/63, pulse 115,  temperature 98 F (36.7 C), temperature source Oral, resp. rate 16, height 5\' 3"  (1.6 m), weight 138 lb 12.8 oz (62.959 kg), last menstrual period 05/07/2012, SpO2 100.00%.  Physical Exam  Nursing note and vitals reviewed. Constitutional: She is oriented to person, place, and time. She appears well-developed and well-nourished.  HENT:  Head: Normocephalic.  Cardiovascular: Normal rate.   Respiratory: Effort normal.  GI: Soft. Bowel sounds are normal. She exhibits no distension. There is no tenderness. There is no rebound.  Genitourinary: Vagina normal.  Cervix 1cm/th/high vtx ballottable   Musculoskeletal: Normal range of motion.  Neurological: She is alert and oriented to person, place, and time. She has normal reflexes.  Skin: Skin is warm and dry.  Psychiatric: She has a normal mood and affect. Her behavior is normal.   FHR cat 1 toco - irritability   MAU Course  Procedures  NST Lomotil PO  Assessment and Plan  IUP at 33wks Hx PTL Uterine irritability, no cervical change GI upset  Lomotil PO given w RX rv'd [facility] and PTL F/u office as scheduled    LILLARD,SHELLEY M 12/30/2012, 2:13 AM

## 2012-12-31 ENCOUNTER — Other Ambulatory Visit: Payer: Medicaid Other

## 2012-12-31 ENCOUNTER — Encounter (HOSPITAL_COMMUNITY): Payer: Self-pay

## 2012-12-31 ENCOUNTER — Inpatient Hospital Stay (HOSPITAL_COMMUNITY)
Admission: AD | Admit: 2012-12-31 | Discharge: 2012-12-31 | Disposition: A | Discharge status: Home / Self Care | Payer: Medicaid Other | Source: Ambulatory Visit | Attending: Obstetrics and Gynecology | Admitting: Obstetrics and Gynecology

## 2012-12-31 ENCOUNTER — Inpatient Hospital Stay (HOSPITAL_COMMUNITY): Payer: Medicaid Other

## 2012-12-31 ENCOUNTER — Telehealth: Payer: Self-pay | Admitting: Obstetrics and Gynecology

## 2012-12-31 ENCOUNTER — Ambulatory Visit: Payer: Medicaid Other | Admitting: Family Medicine

## 2012-12-31 VITALS — BP 100/60 | Wt 140.0 lb

## 2012-12-31 DIAGNOSIS — N898 Other specified noninflammatory disorders of vagina: Secondary | ICD-10-CM

## 2012-12-31 DIAGNOSIS — Z23 Encounter for immunization: Secondary | ICD-10-CM

## 2012-12-31 DIAGNOSIS — O4700 False labor before 37 completed weeks of gestation, unspecified trimester: Secondary | ICD-10-CM | POA: Insufficient documentation

## 2012-12-31 DIAGNOSIS — Z331 Pregnant state, incidental: Secondary | ICD-10-CM

## 2012-12-31 DIAGNOSIS — Z139 Encounter for screening, unspecified: Secondary | ICD-10-CM

## 2012-12-31 DIAGNOSIS — O09213 Supervision of pregnancy with history of pre-term labor, third trimester: Secondary | ICD-10-CM

## 2012-12-31 LAB — POCT WET PREP (WET MOUNT)
Clue Cells Wet Prep Whiff POC: NEGATIVE
Trichomonas Wet Prep HPF POC: NEGATIVE
pH: 4.5

## 2012-12-31 LAB — POCT URINALYSIS DIPSTICK
Bilirubin, UA: NEGATIVE
Glucose, UA: NEGATIVE
Ketones, UA: NEGATIVE
Leukocytes, UA: NEGATIVE
Nitrite, UA: NEGATIVE
Protein, UA: NEGATIVE
Spec Grav, UA: 1.025
Urobilinogen, UA: NEGATIVE
pH, UA: 6

## 2012-12-31 LAB — FETAL FIBRONECTIN: Fetal Fibronectin: NEGATIVE

## 2012-12-31 MED ORDER — HYDROXYPROGESTERONE CAPROATE 250 MG/ML IM OIL
250.0000 mg | TOPICAL_OIL | Freq: Once | INTRAMUSCULAR | Status: AC
  Administered 2012-12-31: 250 mg via INTRAMUSCULAR

## 2012-12-31 NOTE — Progress Notes (Signed)
[redacted]w[redacted]d S: Pt presents c/o of cramping and contractions since early morning.  Contractions has worsen since 10 am.  Patient taking procardia q 4 hours without relief. Has tried fluids, warm bath and still has lower back pain. Has been on bedrest and no sexual intercourse secondary to husband out of town.  Last night patient also felt some fluid run down her leg and then notices thick amount of mucous mixed colors when she wipes.  O: Pt sitting on exam table in NAD.  Even hair distrubtion.  EGBUS: WNL, moderated amount of thin white discharge noted at introitus and covering vaginal walls.  Parous cervix without lesions, tenderness, but whitish-pink discharge noted on os.  Gravida uterus without tenderness or masses.   Adnexal regions without tenderness or masses. A:  Wet mount negative for clues, trich, BV.       UA: negative       NST: FHR 135 with 15x15 accels, moderate variability.  Toco: contractions q 1-4 minutes, 40-90 secs. palpate mild-moderate.       Betamethasone x 2 doses. P:  Consult with Dr. Normand Sloop and expecting patient in MAU to r/o ROM. ROB in  2 weeks, if no delivery. L.Amarilys Lyles, FNP-BC

## 2012-12-31 NOTE — MAU Note (Signed)
Sent from office, was contractions. Saw pinkish blood on swab.  Denies leaking, had a glob come out last night.

## 2012-12-31 NOTE — Progress Notes (Signed)
[redacted]w[redacted]d Pt complains of extreme cramping since last Friday.

## 2012-12-31 NOTE — Telephone Encounter (Signed)
Continuing note.  Was seen  For diarrhea and cramping last week. Diarrhea has stopped. States having continued vaginal pressure. Had increase yellow mucuousy vag D/C. +FM.  Sched for eval with LC today. Also to get 17 P at visit.

## 2012-12-31 NOTE — Telephone Encounter (Signed)
TC from pt. States is having abd cramping that is constant and is painful when trying to move. Was seen last week

## 2012-12-31 NOTE — MAU Provider Note (Signed)
History   27 yo [id number] at 34 weeks presented from office for amnisure--had been seen there for ? Leaking and contractions.  Cervix was 1 cm, thick at office.  Has been on Procardia prn during pregnancy--received betamethasone 2/6 and 11/27/12 due to PTL.  Had +FFN 2 weeks ago, with prior negatives 1 and 2 months ago.  Was hospitalized 2/6 to 2/9 with magnesium sulfate utilized for contraction control and 2/24 to 2/25 for PTL.   Feels Procardia no longer works for control of contractions.  Has been on 17P.  Had normal growth and fluid on [place] 12/17/12 at [facility]--vtx, EFW 3+15, 51%ile.  AFI 17.84, 66%ile.  Cervix 3.3, to 2.4 with Valsalva.  UCs q 1-4 minutes on NST at office.   Patient declines any further tocolytics.  Chief Complaint  Patient presents with  . Contractions     OB History   Grav Para Term Preterm Abortions TAB SAB Ect Mult Living   4 2 1 1 1  0 0 1 0 2      Past Medical History  Diagnosis Date  . UTI (urinary tract infection)   . Ectopic pregnancy   . Spinal headache     ? bad migraines following delivery  . Irregular heartbeat 2009    Hx of Echo done  . Preterm labor 2008    Late 2nd or Early 3rd trimester PTL, injection given to stop contractions, was d/c home w/ meds  . Preterm labor 2009    Preterm birth @ 32 wks; Abrupted placenta  . Preterm labor 2009    Prior to delivery of 2nd child;had contractions frequently  . Infection     BV;not frequent  . Infection     UTI;not frequent    Past Surgical History  Procedure Laterality Date  . No past surgeries      Family History  Problem Relation Age of Onset  . Hearing loss Neg Hx   . Other Mother     Charcot Byrd Hesselbach tooth  . Other Maternal Grandfather     Charcot Maria tooth  . Cancer Maternal Aunt     Breast;before menopause  . Cerebral palsy Sister     Twins;1/2 sisters    History  Substance Use Topics  . Smoking status: Never Smoker   . Smokeless tobacco: Never Used  . Alcohol Use: No     Allergies: No Known Allergies  Prescriptions prior to admission  Medication Sig Dispense Refill  . NIFEdipine (PROCARDIA) 10 MG capsule Take 1 capsule (10 mg total) by mouth every 4 (four) hours as needed (contractions).  30 capsule  2  . Prenatal Vit-Fe Fumarate-FA (PRENATAL MULTIVITAMIN) TABS Take 1 tablet by mouth daily.      . sertraline (ZOLOFT) 25 MG tablet Take 50 mg by mouth 2 (two) times daily.       . diphenoxylate-atropine (LOMOTIL) 2.5-0.025 MG per tablet Take 1-2 tablets by mouth once. Take 1-2 tablets with each episode of diarrhea, not to exceed 8 tablets in 24 hours  10 tablet  0     Physical Exam   Blood pressure 115/68, pulse 86, temperature 97.8 F (36.6 C), temperature source Oral, resp. rate 18, height 5\' 2"  (1.575 m), weight 138 lb (62.596 kg), last menstrual period 05/07/2012.  Chest clear Heart RRR without murmur Abd gravid, NT, UCs palpate as mild Pelvic--1 cm, thick at admission on my exam, and unchanged on repeat exam by RN prior to d/c.  Vtx, -2 Ext WNL  FHR Category  1 UCs q 2 min--this has been a frequent finding on previous monitoring.  Results for orders placed during the hospital encounter of 12/31/12 (from the past 24 hour(s))  AMNISURE RUPTURE OF MEMBRANE (ROM)     Status: None   Collection Time    12/31/12  1:31 PM      Result Value Range   Amnisure ROM NEGATIVE     Wet prep and UA negative at office. FFN pending from office.  [place]: Vtx, AFI 22.07, 83%ile, placenta WNL  ED Course  IUP at 34 weeks Persistent uterine activity without cervical change Amnisure negative Hx betamethasone 2/6-2/7. Declines tocolysis--providers concur.  Plan: D/C home with labor precautions Keep scheduled appt in 1 week.    Nigel Bridgeman CNM, [place] 12/31/2012 4:36 PM

## 2012-12-31 NOTE — Progress Notes (Signed)
Notified of sve result.

## 2013-01-01 ENCOUNTER — Telehealth: Payer: Self-pay | Admitting: Obstetrics and Gynecology

## 2013-01-01 LAB — GC/CHLAMYDIA PROBE AMP
CT Probe RNA: NEGATIVE
GC Probe RNA: NEGATIVE

## 2013-01-01 NOTE — Telephone Encounter (Signed)
TC from patient--seen yesterday in MAU for r/o rupture and contractions. Cervix 1 cm, thick, vtx, with negative amnisure. Frequent contractions (which has been her pattern on multiple evaluations for PTL), with no further plans for tocolysis. Feels contractions are stronger today, denies leaking, does have some mucusy d/c.  +FM. Questions whether there is pain medication available, if she is not in labor. Offered MAU evaluation, may benefit from IV hydration and pain med, which has assisted in diminishing contractions in the past. Patient elects to continue to observe at present, and will come to MAU as desired. Hx +FFN 2/24, with negative f/u yesterday--received betamethasone 2/6 and 11/27/12.

## 2013-01-04 ENCOUNTER — Encounter: Payer: BC Managed Care – PPO | Admitting: Obstetrics and Gynecology

## 2013-01-08 ENCOUNTER — Encounter: Payer: Self-pay | Admitting: Family Medicine

## 2013-01-08 ENCOUNTER — Ambulatory Visit: Payer: Medicaid Other | Admitting: Family Medicine

## 2013-01-08 VITALS — BP 110/64 | Wt 141.0 lb

## 2013-01-08 DIAGNOSIS — Z331 Pregnant state, incidental: Secondary | ICD-10-CM

## 2013-01-08 NOTE — Progress Notes (Signed)
[redacted]w[redacted]d No change in this current episode of back pain versus previous pain. Comfort measures discussed.   No discharge noted on exam. GBS done. Pt would like to use Micronor for contraception while breastfeeding, car seat read, will bring name of peds to next visit. ROB on 3/27 with growth ultrasound r/t hypocoiled cord. L.Nahla Lukin, FNP-BC

## 2013-01-08 NOTE — Progress Notes (Signed)
[redacted]w[redacted]d Complains of extreme lower back pain and cramping. States that arms go weak with cramping. On last pm passed a big clump of white discharge.

## 2013-01-09 LAB — STREP B DNA PROBE: GBSP: POSITIVE

## 2013-01-16 ENCOUNTER — Inpatient Hospital Stay (HOSPITAL_COMMUNITY)
Admission: AD | Admit: 2013-01-16 | Discharge: 2013-01-17 | Disposition: A | Discharge status: Home / Self Care | Payer: Medicaid Other | Source: Ambulatory Visit | Attending: Obstetrics and Gynecology | Admitting: Obstetrics and Gynecology

## 2013-01-16 ENCOUNTER — Encounter (HOSPITAL_COMMUNITY): Payer: Self-pay

## 2013-01-16 DIAGNOSIS — O479 False labor, unspecified: Secondary | ICD-10-CM | POA: Insufficient documentation

## 2013-01-16 MED ORDER — LACTATED RINGERS IV BOLUS (SEPSIS)
1000.0000 mL | Freq: Once | INTRAVENOUS | Status: AC
  Administered 2013-01-16: 1000 mL via INTRAVENOUS

## 2013-01-16 MED ORDER — BUTORPHANOL TARTRATE 1 MG/ML IJ SOLN
1.0000 mg | Freq: Once | INTRAMUSCULAR | Status: DC
  Filled 2013-01-16: qty 1

## 2013-01-16 MED ORDER — HYDROXYZINE HCL 50 MG PO TABS
50.0000 mg | ORAL_TABLET | Freq: Once | ORAL | Status: AC
  Administered 2013-01-16: 50 mg via ORAL
  Filled 2013-01-16: qty 1

## 2013-01-16 NOTE — MAU Provider Note (Signed)
History     CSN: [id number]  Arrival date and time: 01/16/13 2204   First Provider Initiated Contact with Patient 01/16/13 2231      Chief Complaint  Patient presents with  . Contractions   HPI Comments: Pt is a [id number] at [redacted]w[redacted]d that arrives w c/o ctx that started tonight, feels these are stronger than she's had before, also c/o legs feeling very shaky and episode of diarrhea. She denies any VB, LOF, reports GFM. Pt has been seen multiple times during 3rd trimester for ctx, had a +FFN and had been taking procardia. States she's not had hardly any ctx until they started tonight. Requests pain medication     Past Medical History  Diagnosis Date  . UTI (urinary tract infection)   . Ectopic pregnancy   . Spinal headache     ? bad migraines following delivery  . Irregular heartbeat 2009    Hx of Echo done  . Preterm labor 2008    Late 2nd or Early 3rd trimester PTL, injection given to stop contractions, was d/c home w/ meds  . Preterm labor 2009    Preterm birth @ 32 wks; Abrupted placenta  . Preterm labor 2009    Prior to delivery of 2nd child;had contractions frequently  . Infection     BV;not frequent  . Infection     UTI;not frequent    Past Surgical History  Procedure Laterality Date  . No past surgeries      Family History  Problem Relation Age of Onset  . Hearing loss Neg Hx   . Other Mother     Charcot Byrd Hesselbach tooth  . Other Maternal Grandfather     Charcot Maria tooth  . Cancer Maternal Aunt     Breast;before menopause  . Cerebral palsy Sister     Twins;1/2 sisters    History  Substance Use Topics  . Smoking status: Never Smoker   . Smokeless tobacco: Never Used  . Alcohol Use: No    Allergies: No Known Allergies  Prescriptions prior to admission  Medication Sig Dispense Refill  . Prenatal Vit-Fe Fumarate-FA (PRENATAL MULTIVITAMIN) TABS Take 1 tablet by mouth daily.      . sertraline (ZOLOFT) 25 MG tablet Take 50 mg by mouth 2 (two) times daily.          Review of Systems  Gastrointestinal: Positive for diarrhea.  Psychiatric/Behavioral: The patient is nervous/anxious.   All other systems reviewed and are negative.   Physical Exam   Blood pressure 123/79, pulse 124, temperature 97.8 F (36.6 C), temperature source Oral, resp. rate 18, last menstrual period 05/07/2012, unknown if currently breastfeeding.  Physical Exam  Nursing note and vitals reviewed. Constitutional: She is oriented to person, place, and time. She appears well-developed and well-nourished. She appears distressed.  grimacing  Neck: Normal range of motion.  Cardiovascular:  Tachycardia noted  Respiratory: Effort normal.  GI: Soft.  Genitourinary: Vagina normal.  Musculoskeletal: Normal range of motion.  Neurological: She is alert and oriented to person, place, and time. She has normal reflexes.  Skin: Skin is warm and dry.  Psychiatric: She has a normal mood and affect. Her behavior is normal.   FHR - reactive cat 1 toco 1-2 min  MAU Course  Procedures    Assessment and Plan  IUP at [redacted]w[redacted]d Preterm ctx, no cervical change from previous visits FHR reassuring  Offered pt IVF's w stadol or PO meds or further observation and recheck cervix, requests IVF's and stadol  Will give 1liter bolus and 1mg  stadol and recheck cervix in 2 hours  If not in labor d/c home w rx for vistaril PO    Jevaun Strick M 01/16/2013, 10:34 PM   Addendum at 0018  S: pt states she feels better, ctx aren't as painful and feels less anxious Declined stadol because of no one to drive her home, rcv'd PO vistaril   O: FHR reactive, cat 1 toco - irreg, lots of UI VE- no change, vtx ballottable   A: IUP at [redacted]w[redacted]d Not in labor FHR reassuring  P: dc home F/u routine rv'd [facility] and labor sx's Rx for vistaril 50mg  PO q6h PRN #30

## 2013-01-16 NOTE — MAU Note (Signed)
Onset of contractions since 0600 this evening now worse, no vaginal bleeding.

## 2013-01-17 MED ORDER — HYDROXYZINE PAMOATE 50 MG PO CAPS
50.0000 mg | ORAL_CAPSULE | Freq: Four times a day (QID) | ORAL | Status: DC | PRN
Start: 2013-01-17 — End: 2013-01-28

## 2013-01-21 ENCOUNTER — Inpatient Hospital Stay (HOSPITAL_COMMUNITY)
Admission: AD | Admit: 2013-01-21 | Discharge: 2013-01-21 | Disposition: A | Discharge status: Home / Self Care | Payer: Medicaid Other | Source: Ambulatory Visit | Attending: Obstetrics and Gynecology | Admitting: Obstetrics and Gynecology

## 2013-01-21 ENCOUNTER — Encounter (HOSPITAL_COMMUNITY): Payer: Self-pay

## 2013-01-21 DIAGNOSIS — O479 False labor, unspecified: Secondary | ICD-10-CM | POA: Insufficient documentation

## 2013-01-21 MED ORDER — CALCIUM CARBONATE ANTACID 500 MG PO CHEW
1.0000 | CHEWABLE_TABLET | Freq: Once | ORAL | Status: AC
  Administered 2013-01-21: 200 mg via ORAL
  Filled 2013-01-21: qty 1

## 2013-01-21 NOTE — MAU Provider Note (Signed)
  History  27 yo [id number] at [redacted]w[redacted]d presented with c/o UCs every 3 min.  Denies VB, LOF, HA, or visual changes.  CSN: [id number]  Arrival date and time: 01/21/13 1716   None     Chief Complaint  Patient presents with  . Labor Eval   HPI  OB History   Grav Para Term Preterm Abortions TAB SAB Ect Mult Living   4 2 1 1 1  0 0 1 0 2      Past Medical History  Diagnosis Date  . UTI (urinary tract infection)   . Ectopic pregnancy   . Spinal headache     ? bad migraines following delivery  . Irregular heartbeat 2009    Hx of Echo done  . Preterm labor 2008    Late 2nd or Early 3rd trimester PTL, injection given to stop contractions, was d/c home w/ meds  . Preterm labor 2009    Preterm birth @ 32 wks; Abrupted placenta  . Preterm labor 2009    Prior to delivery of 2nd child;had contractions frequently  . Infection     BV;not frequent  . Infection     UTI;not frequent    Past Surgical History  Procedure Laterality Date  . No past surgeries      Family History  Problem Relation Age of Onset  . Hearing loss Neg Hx   . Other Mother     Charcot Byrd Hesselbach tooth  . Other Maternal Grandfather     Charcot Maria tooth  . Cancer Maternal Aunt     Breast;before menopause  . Cerebral palsy Sister     Twins;1/2 sisters    History  Substance Use Topics  . Smoking status: Never Smoker   . Smokeless tobacco: Never Used  . Alcohol Use: No    Allergies: No Known Allergies  Prescriptions prior to admission  Medication Sig Dispense Refill  . acetaminophen (TYLENOL) 500 MG tablet Take 1,000 mg by mouth every 6 (six) hours as needed for pain.      . hydrOXYzine (VISTARIL) 50 MG capsule Take 1 capsule (50 mg total) by mouth every 6 (six) hours as needed for anxiety (sleep).  30 capsule  0  . Prenatal Vit-Fe Fumarate-FA (PRENATAL MULTIVITAMIN) TABS Take 1 tablet by mouth daily at 12 noon.      . sertraline (ZOLOFT) 25 MG tablet Take 50 mg by mouth 2 (two) times daily.         ROS -  see above  Physical Exam   Blood pressure 135/80, pulse 97, temperature 98 F (36.7 C), temperature source Oral, resp. rate 16, last menstrual period 05/07/2012, SpO2 97.00%.  Physical Exam Chest: Clear Heart: RRR Pelvic: 1.5 / 70 / -1 Extremities: WNL  MAU Course  Procedures  NST   Assessment and Plan  IUP at [redacted]w[redacted]d Labor check  Pt to walk Recheck labor progress in 1-2 hours Report given to Baldemar Lenis CNM, MSN 01/21/2013, 7:33 PM

## 2013-01-21 NOTE — MAU Note (Signed)
No bleeding or lof.

## 2013-01-21 NOTE — MAU Note (Signed)
Patient states she was in the office this am and cervix was 1 1/12 cm. Contractions every 3 minutes. Denies bleeding or leaking and reports good fetal movement.

## 2013-01-21 NOTE — Progress Notes (Signed)
S: Pt has been ambulating, states ctx about the same, coping well  O: VSS FHR reactive cat 1 toco frequent UI w occ ctx VE = loose 1/80/-2 no change   A: 37wks not in active labor  P: d/c home, f/u routine,  rv'd [facility] and labor sx's

## 2013-01-22 ENCOUNTER — Inpatient Hospital Stay (HOSPITAL_COMMUNITY)
Admission: AD | Admit: 2013-01-22 | Discharge: 2013-01-22 | Disposition: A | Discharge status: Home / Self Care | Payer: Medicaid Other | Source: Ambulatory Visit | Attending: Obstetrics and Gynecology | Admitting: Obstetrics and Gynecology

## 2013-01-22 ENCOUNTER — Encounter (HOSPITAL_COMMUNITY): Payer: Self-pay | Admitting: *Deleted

## 2013-01-22 DIAGNOSIS — O479 False labor, unspecified: Secondary | ICD-10-CM | POA: Diagnosis present

## 2013-01-22 DIAGNOSIS — Z22330 Carrier of Group B streptococcus: Secondary | ICD-10-CM | POA: Insufficient documentation

## 2013-01-22 DIAGNOSIS — O99891 Other specified diseases and conditions complicating pregnancy: Secondary | ICD-10-CM | POA: Insufficient documentation

## 2013-01-22 NOTE — MAU Note (Signed)
Pt states uc's have not stopped since she was seen in MAU last night, denies any bleeding, had a trickle of ? Fluid last night, none now.

## 2013-01-22 NOTE — MAU Provider Note (Signed)
History   27 yo [id number] at  25 1/7 weeks presented c/o ongoing contractions--seen last night in MAU till approx 9pm for labor check, with cervix 1.2, 80% and unchanged after observation period.  Also reports ? Leaking last night after d/c.  Reports +FM, denies bleeding.  Patient Active Problem List  Diagnosis  . Pregnant state, incidental  . Prior pregnancy with placenta abruption, antepartum  . History of ectopic pregnancy  . Preterm labor second trimester with preterm delivery third trimester  . Preterm delivery  . Anomaly of umbilical cord  . Preterm labor  Hypocoiled cord--normal [place] on Monday for growth and fluid. Hx abruption at 32 weeks with previous baby.   Chief Complaint  Patient presents with  . Labor Eval     OB History   Grav Para Term Preterm Abortions TAB SAB Ect Mult Living   4 2 1 1 1  0 0 1 0 2      Past Medical History  Diagnosis Date  . UTI (urinary tract infection)   . Ectopic pregnancy   . Spinal headache     ? bad migraines following delivery  . Irregular heartbeat 2009    Hx of Echo done  . Preterm labor 2008    Late 2nd or Early 3rd trimester PTL, injection given to stop contractions, was d/c home w/ meds  . Preterm labor 2009    Preterm birth @ 32 wks; Abrupted placenta  . Preterm labor 2009    Prior to delivery of 2nd child;had contractions frequently  . Infection     BV;not frequent  . Infection     UTI;not frequent    Past Surgical History  Procedure Laterality Date  . No past surgeries      Family History  Problem Relation Age of Onset  . Hearing loss Neg Hx   . Other Mother     Charcot Byrd Hesselbach tooth  . Other Maternal Grandfather     Charcot Maria tooth  . Cancer Maternal Aunt     Breast;before menopause  . Cerebral palsy Sister     Twins;1/2 sisters    History  Substance Use Topics  . Smoking status: Never Smoker   . Smokeless tobacco: Never Used  . Alcohol Use: No    Allergies: No Known Allergies  Prescriptions  prior to admission  Medication Sig Dispense Refill  . acetaminophen (TYLENOL) 500 MG tablet Take 1,000 mg by mouth every 6 (six) hours as needed for pain.      . hydrOXYzine (VISTARIL) 50 MG capsule Take 1 capsule (50 mg total) by mouth every 6 (six) hours as needed for anxiety (sleep).  30 capsule  0  . Prenatal Vit-Fe Fumarate-FA (PRENATAL MULTIVITAMIN) TABS Take 1 tablet by mouth daily at 12 noon.      . sertraline (ZOLOFT) 25 MG tablet Take 50 mg by mouth 2 (two) times daily.          Physical Exam   Blood pressure 113/81, pulse 103, temperature 97 F (36.1 C), temperature source Oral, resp. rate 18, last menstrual period 05/07/2012.  Chest clear  Heart RRR without murmur Abd gravid, NT, soft between UCs. Pelvic--posterior, loose 1 cm, 75%, vtx, -2 (no change from exam last night) Ext WNL  FHR reactive UCs q 2-3 min, mild (same pattern as last night)  ED Course  IUP at 37 1/7 weeks ? Early vs. Prodromal labor GBS positive  Plan: Amnisure Ambulate x 1 hour, then re-evaluate.   Nigel Bridgeman CNM,  [place] 01/22/2013 9:20 AM  Addendum: Returned from walking--in NAD. UCs same FHR  Category 1 Amnisure negative  D/C'd home with labor precautions and FKCs Keep scheduled appt at [facility] next week--patient may want membranes swept.  She will discuss at that visit.  Nigel Bridgeman, CNM 01/22/13 10:35a

## 2013-01-24 ENCOUNTER — Inpatient Hospital Stay (HOSPITAL_COMMUNITY)
Admission: AD | Admit: 2013-01-24 | Discharge: 2013-01-24 | Disposition: A | Discharge status: Home / Self Care | Payer: Medicaid Other | Source: Ambulatory Visit | Attending: Obstetrics and Gynecology | Admitting: Obstetrics and Gynecology

## 2013-01-24 ENCOUNTER — Encounter (HOSPITAL_COMMUNITY): Payer: Self-pay | Admitting: Obstetrics and Gynecology

## 2013-01-24 DIAGNOSIS — O479 False labor, unspecified: Secondary | ICD-10-CM | POA: Insufficient documentation

## 2013-01-24 DIAGNOSIS — N949 Unspecified condition associated with female genital organs and menstrual cycle: Secondary | ICD-10-CM | POA: Insufficient documentation

## 2013-01-24 MED ORDER — NALBUPHINE HCL 10 MG/ML IJ SOLN
10.0000 mg | Freq: Once | INTRAMUSCULAR | Status: AC
  Administered 2013-01-24: 10 mg via INTRAMUSCULAR
  Filled 2013-01-24: qty 1

## 2013-01-24 MED ORDER — PROMETHAZINE HCL 25 MG/ML IJ SOLN: 25.0000 mg | Freq: Once | INTRAMUSCULAR | Status: DC

## 2013-01-24 MED ORDER — PROMETHAZINE HCL 25 MG/ML IJ SOLN
25.0000 mg | Freq: Once | INTRAMUSCULAR | Status: AC
  Administered 2013-01-24: 25 mg via INTRAVENOUS
  Filled 2013-01-24: qty 1

## 2013-01-24 NOTE — MAU Note (Signed)
Pt reports haivnig increased pelvic pressure since last night. Reports having ctx 3-4 min apart. Denies  SROM or bleeding at this time.

## 2013-01-25 ENCOUNTER — Encounter (HOSPITAL_COMMUNITY): Payer: Self-pay | Admitting: *Deleted

## 2013-01-25 ENCOUNTER — Inpatient Hospital Stay (HOSPITAL_COMMUNITY)
Admission: AD | Admit: 2013-01-25 | Discharge: 2013-01-25 | Disposition: A | Discharge status: Home / Self Care | Payer: Medicaid Other | Source: Ambulatory Visit | Attending: Obstetrics and Gynecology | Admitting: Obstetrics and Gynecology

## 2013-01-25 DIAGNOSIS — O368190 Decreased fetal movements, unspecified trimester, not applicable or unspecified: Secondary | ICD-10-CM | POA: Insufficient documentation

## 2013-01-25 DIAGNOSIS — R109 Unspecified abdominal pain: Secondary | ICD-10-CM | POA: Insufficient documentation

## 2013-01-25 MED ORDER — NALBUPHINE HCL 20 MG/ML IJ SOLN
20.0000 mg | INTRAMUSCULAR | Status: DC | PRN
  Filled 2013-01-25: qty 1

## 2013-01-25 MED ORDER — NALBUPHINE SYRINGE 5 MG/0.5 ML
20.0000 mg | INJECTION | INTRAMUSCULAR | Status: DC | PRN
  Administered 2013-01-25: 20 mg via SUBCUTANEOUS
  Filled 2013-01-25: qty 2

## 2013-01-25 NOTE — MAU Note (Signed)
uc's since 0815, lots of pressure, mucus discharge, denies bleeding or LOF.  Has felt baby move once since 0700.

## 2013-01-25 NOTE — MAU Provider Note (Signed)
History   27 yo [id number] at 37 4/7 weeks presented c/o decreased FM this am after awakening at 7am.  Had normal FM yesterday, and was seen in MAU yesterday afternoon for cramping and pelvic pressure, with no cervical change after observation.  Received Nubain in MAU with benefit, and was able to sleep during the night.  Has had an irritable uterus this pregnancy, and is followed at the office with [place] q 4 weeks due to hypocoiled cord.  Now noting FM in MAU.  Denies leaking or bleeding.  Next ROB appt is Wednesday, 01/27/13.  Patient Active Problem List  Diagnosis  . Pregnant state, incidental  . Prior pregnancy with placenta abruption, antepartum  . History of ectopic pregnancy  . Preterm labor second trimester with preterm delivery third trimester  . Preterm delivery  . Anomaly of umbilical cord  . Preterm labor  . False labor     Chief Complaint  Patient presents with  . Labor Eval  . Decreased Fetal Movement     OB History   Grav Para Term Preterm Abortions TAB SAB Ect Mult Living   4 2 1 1 1  0 0 1 0 2      Past Medical History  Diagnosis Date  . UTI (urinary tract infection)   . Ectopic pregnancy   . Spinal headache     ? bad migraines following delivery  . Irregular heartbeat 2009    Hx of Echo done  . Preterm labor 2008    Late 2nd or Early 3rd trimester PTL, injection given to stop contractions, was d/c home w/ meds  . Preterm labor 2009    Preterm birth @ 32 wks; Abrupted placenta  . Preterm labor 2009    Prior to delivery of 2nd child;had contractions frequently  . Infection     BV;not frequent  . Infection     UTI;not frequent    Past Surgical History  Procedure Laterality Date  . No past surgeries      Family History  Problem Relation Age of Onset  . Hearing loss Neg Hx   . Other Mother     Charcot Byrd Hesselbach tooth  . Other Maternal Grandfather     Charcot Maria tooth  . Cancer Maternal Aunt     Breast;before menopause  . Cerebral palsy Sister    Twins;1/2 sisters    History  Substance Use Topics  . Smoking status: Never Smoker   . Smokeless tobacco: Never Used  . Alcohol Use: No    Allergies: No Known Allergies  Prescriptions prior to admission  Medication Sig Dispense Refill  . acetaminophen (TYLENOL) 500 MG tablet Take 1,000 mg by mouth every 6 (six) hours as needed for pain.      . Prenatal Vit-Fe Fumarate-FA (PRENATAL MULTIVITAMIN) TABS Take 1 tablet by mouth daily at 12 noon.      . sertraline (ZOLOFT) 25 MG tablet Take 50 mg by mouth 2 (two) times daily.       . hydrOXYzine (VISTARIL) 50 MG capsule Take 1 capsule (50 mg total) by mouth every 6 (six) hours as needed for anxiety (sleep).  30 capsule  0    Physical Exam   Blood pressure 100/79, pulse 115, temperature 96.8 F (36 C), temperature source Oral, resp. rate 20, last menstrual period 05/07/2012.  Chest clear  Heart RRR without murmur Abd gravid, NT Pelvic--cervix posterior, 1-2, 70%, vtx, -1.  Rechecked after 1 hour, no change in cervix Ext WNL  FHR reactive,  no decels UCs q 2 min, mild (this is same contraction pattern she has had on multiple MAU evaluations).  ED Course  IUP at 37 4/7 weeks Reactive NST Irritable uterus  Plan: D/C home with Nubain for therapeutic rest per patient request. Keep visit on Wednesday at [facility] or call prn. FKCs reviewed.   Nigel Bridgeman CNM, [place] 01/25/2013 1:32 PM

## 2013-01-27 ENCOUNTER — Inpatient Hospital Stay (HOSPITAL_COMMUNITY)
Admission: AD | Admit: 2013-01-27 | Discharge: 2013-01-27 | Disposition: A | Discharge status: Home / Self Care | Payer: Medicaid Other | Source: Ambulatory Visit | Attending: Obstetrics and Gynecology | Admitting: Obstetrics and Gynecology

## 2013-01-27 ENCOUNTER — Encounter (HOSPITAL_COMMUNITY): Payer: Self-pay

## 2013-01-27 DIAGNOSIS — Z22330 Carrier of Group B streptococcus: Secondary | ICD-10-CM | POA: Insufficient documentation

## 2013-01-27 DIAGNOSIS — O479 False labor, unspecified: Secondary | ICD-10-CM | POA: Insufficient documentation

## 2013-01-27 DIAGNOSIS — O99891 Other specified diseases and conditions complicating pregnancy: Secondary | ICD-10-CM | POA: Insufficient documentation

## 2013-01-27 MED ORDER — NALBUPHINE HCL 10 MG/ML IJ SOLN
10.0000 mg | Freq: Once | INTRAMUSCULAR | Status: AC
  Administered 2013-01-27: 10 mg via SUBCUTANEOUS
  Filled 2013-01-27: qty 1

## 2013-01-27 NOTE — MAU Provider Note (Signed)
History   Jamie Wright is a 26y.o. WF at [redacted]w[redacted]d who presents for labor check w/ CC of regular, painful ctxs since around 1600.  Seen around lunch-time today for regularly scheduled OB appt, and cx 3cm and membranes swept by dr. Estanislado Pandy.  Denies LOF, VB, UTI, or PIH s/s.  Normal FM.  RN checked pt around 1800, and cx=3.5 cm.  Pt last seen in MAU 4/7 for decreased FM.  She has had a irritable uterus this pregnancy.  She requested "nubain" for pain while observed for 3.5 hrs for labor progress.  She has also ambulated for 1hr during this observation. .. Patient Active Problem List  Diagnosis  . Pregnant state, incidental  . Prior pregnancy with placenta abruption, antepartum  . History of ectopic pregnancy  . Preterm labor second trimester with preterm delivery third trimester  . Preterm delivery  . Anomaly of umbilical cord  . Preterm labor  . False labor    CSN: [id number]  Arrival date and time: 01/27/13 1702   None     Chief Complaint  Patient presents with  . Labor Eval   HPI  OB History   Grav Para Term Preterm Abortions TAB SAB Ect Mult Living   4 2 1 1 1  0 0 1 0 2      Past Medical History  Diagnosis Date  . UTI (urinary tract infection)   . Ectopic pregnancy   . Spinal headache     ? bad migraines following delivery  . Irregular heartbeat 2009    Hx of Echo done  . Preterm labor 2008    Late 2nd or Early 3rd trimester PTL, injection given to stop contractions, was d/c home w/ meds  . Preterm labor 2009    Preterm birth @ 32 wks; Abrupted placenta  . Preterm labor 2009    Prior to delivery of 2nd child;had contractions frequently  . Infection     BV;not frequent  . Infection     UTI;not frequent    Past Surgical History  Procedure Laterality Date  . No past surgeries      Family History  Problem Relation Age of Onset  . Hearing loss Neg Hx   . Other Mother     Charcot Byrd Hesselbach tooth  . Other Maternal Grandfather     Charcot Maria tooth  . Cancer  Maternal Aunt     Breast;before menopause  . Cerebral palsy Sister     Twins;1/2 sisters    History  Substance Use Topics  . Smoking status: Never Smoker   . Smokeless tobacco: Never Used  . Alcohol Use: No    Allergies: No Known Allergies  Prescriptions prior to admission  Medication Sig Dispense Refill  . acetaminophen (TYLENOL) 500 MG tablet Take 1,000 mg by mouth every 6 (six) hours as needed for pain.      . hydrOXYzine (VISTARIL) 50 MG capsule Take 1 capsule (50 mg total) by mouth every 6 (six) hours as needed for anxiety (sleep).  30 capsule  0  . Prenatal Vit-Fe Fumarate-FA (PRENATAL MULTIVITAMIN) TABS Take 1 tablet by mouth daily at 12 noon.      . sertraline (ZOLOFT) 25 MG tablet Take 25 mg by mouth 2 (two) times daily.         ROS--see HPIV Physical Exam   Last menstrual period 05/07/2012.  Physical Exam  Constitutional: She is oriented to person, place, and time. She appears well-developed and well-nourished. No distress.  Inconsistent appearance w/ ctxs.  Grimaces at times; no labored breathing.    HENT:  Head: Normocephalic.  Eyes: Pupils are equal, round, and reactive to light.  Cardiovascular: Normal rate.   Respiratory: Effort normal.  GI: Soft.  gravid  Genitourinary:  cx x3 exams in MAU=3-4/80/-1, posterior; intact, vtx; cx not stretchy; no show  Of note: vulva and genitalia, edematous  Neurological: She is alert and oriented to person, place, and time.  Skin: Skin is warm and dry.  Psychiatric: She has a normal mood and affect. Her behavior is normal. Judgment and thought content normal.    MAU Course  Procedures 1. NST: 135, reactive, no decels, moderate variability; TOCO: UC's q1-4 w/ irritability; rest palpated and mild to mod in strength  Assessment and Plan  1. [redacted]w[redacted]d 2. Threatened labor at term s/p membranes being swept at office earlier today 3. GBS pos 4. Cat I FHT  1. D/c'd home after observing for 3.5 hrs and no cervical change   2. Labor precautions and fkc rev'd 3. Pt requested nubain before d/c; given 10mg  [name] x1; husband picked her up; other comfort measures rev'd. 4. F/u as scheduled at office, or prn worsening s/s, or other concerns  [name] H 01/27/2013, 9:47 PM

## 2013-01-27 NOTE — MAU Note (Signed)
Patient is in for labor eval ctx q72m, 3/90 and membrane stripped this morning per patient. She denies vaginal bleeding or lof. She reports good fetal movement.

## 2013-01-28 ENCOUNTER — Encounter (HOSPITAL_COMMUNITY): Payer: Self-pay | Admitting: *Deleted

## 2013-01-28 ENCOUNTER — Inpatient Hospital Stay (HOSPITAL_COMMUNITY)
Admission: AD | Admit: 2013-01-28 | Discharge: 2013-01-28 | Disposition: A | Discharge status: Home / Self Care | Payer: Medicaid Other | Source: Ambulatory Visit | Attending: Obstetrics and Gynecology | Admitting: Obstetrics and Gynecology

## 2013-01-28 DIAGNOSIS — O479 False labor, unspecified: Secondary | ICD-10-CM

## 2013-01-28 MED ORDER — NALBUPHINE HCL 10 MG/ML IJ SOLN
10.0000 mg | Freq: Once | INTRAMUSCULAR | Status: AC
  Administered 2013-01-28: 10 mg via SUBCUTANEOUS
  Filled 2013-01-28 (×2): qty 1

## 2013-01-28 MED ORDER — ONDANSETRON HCL 4 MG PO TABS
4.0000 mg | ORAL_TABLET | Freq: Once | ORAL | Status: AC
  Administered 2013-01-28: 4 mg via ORAL
  Filled 2013-01-28: qty 1

## 2013-01-28 MED ORDER — LACTATED RINGERS IV BOLUS (SEPSIS)
1000.0000 mL | Freq: Once | INTRAVENOUS | Status: AC
  Administered 2013-01-28: 1000 mL via INTRAVENOUS

## 2013-01-28 NOTE — MAU Note (Signed)
Was 3+last night, after nubaine wore off ctx's have really kicked in. No leaking, had some bloody mucous this morning.

## 2013-01-28 NOTE — Progress Notes (Signed)
CNM informed of SVE, pt is requesting phenergan & nubain IM, orders for IV & meds placed by CNM.

## 2013-01-28 NOTE — MAU Note (Signed)
Pt had membranes stripped yesterday, uc's started again during the night, has had more mucus discharge, denies bleeding or LOF.  uc's are hurting more in her pelvis & back than before.

## 2013-01-28 NOTE — Progress Notes (Signed)
CNM states plan is for pt to receive her nubain & then go home.

## 2013-01-28 NOTE — MAU Provider Note (Signed)
History   26yo, [phone] at [redacted]w[redacted]d presents to rule out labor. C/o UCs for a couple of weeks.  Chief Complaint  Patient presents with  . Labor Eval   @SFHPI @  OB History   Grav Para Term Preterm Abortions TAB SAB Ect Mult Living   4 2 1 1 1  0 0 1 0 2      Past Medical History  Diagnosis Date  . UTI (urinary tract infection)   . Ectopic pregnancy   . Spinal headache     ? bad migraines following delivery  . Irregular heartbeat 2009    Hx of Echo done  . Preterm labor 2008    Late 2nd or Early 3rd trimester PTL, injection given to stop contractions, was d/c home w/ meds  . Preterm labor 2009    Preterm birth @ 32 wks; Abrupted placenta  . Preterm labor 2009    Prior to delivery of 2nd child;had contractions frequently  . Infection     BV;not frequent  . Infection     UTI;not frequent    Past Surgical History  Procedure Laterality Date  . No past surgeries      Family History  Problem Relation Age of Onset  . Hearing loss Neg Hx   . Other Mother     Charcot Byrd Hesselbach tooth  . Other Maternal Grandfather     Charcot Maria tooth  . Cancer Maternal Aunt     Breast;before menopause  . Cerebral palsy Sister     Twins;1/2 sisters    History  Substance Use Topics  . Smoking status: Never Smoker   . Smokeless tobacco: Never Used  . Alcohol Use: No    Allergies: No Known Allergies  No prescriptions prior to admission    ROS - See HPI above  Physical Exam   Blood pressure 112/66, pulse 88, temperature 98 F (36.7 C), temperature source Oral, resp. rate 18, height 5\' 2"  (1.575 m), weight 142 lb (64.411 kg), last menstrual period 05/07/2012.  Chest: Clear Heart: RRR Abdomen: gravid, NT Extremities: WNL  Pelvic:3.5 / 80% / -3 - no change since last check yesterday night  ED Course  IUP at [redacted]w[redacted]d Prolonged latent phase  C/w Dr Stefano Gaul D/c home Percocet and Ambien rx given Pt agreeable to go home to labor Encouraged to call or come in if symptoms worsen     Haroldine Laws CNM, MSN 01/28/2013 4:14 PM

## 2013-01-29 ENCOUNTER — Inpatient Hospital Stay (HOSPITAL_COMMUNITY): Payer: Medicaid Other | Admitting: Anesthesiology

## 2013-01-29 ENCOUNTER — Inpatient Hospital Stay (HOSPITAL_COMMUNITY)
Admission: AD | Admit: 2013-01-29 | Discharge: 2013-01-31 | DRG: 775 | Disposition: A | Discharge status: Home / Self Care | Payer: Medicaid Other | Source: Ambulatory Visit | Attending: Obstetrics and Gynecology | Admitting: Obstetrics and Gynecology

## 2013-01-29 ENCOUNTER — Encounter (HOSPITAL_COMMUNITY): Payer: Self-pay | Admitting: Anesthesiology

## 2013-01-29 ENCOUNTER — Encounter (HOSPITAL_COMMUNITY): Payer: Self-pay | Admitting: Obstetrics and Gynecology

## 2013-01-29 DIAGNOSIS — O99344 Other mental disorders complicating childbirth: Secondary | ICD-10-CM | POA: Diagnosis present

## 2013-01-29 DIAGNOSIS — O99892 Other specified diseases and conditions complicating childbirth: Secondary | ICD-10-CM | POA: Diagnosis present

## 2013-01-29 DIAGNOSIS — F3289 Other specified depressive episodes: Secondary | ICD-10-CM | POA: Diagnosis present

## 2013-01-29 DIAGNOSIS — F329 Major depressive disorder, single episode, unspecified: Secondary | ICD-10-CM | POA: Diagnosis present

## 2013-01-29 DIAGNOSIS — D689 Coagulation defect, unspecified: Principal | ICD-10-CM | POA: Diagnosis present

## 2013-01-29 DIAGNOSIS — Z22330 Carrier of Group B streptococcus: Secondary | ICD-10-CM

## 2013-01-29 DIAGNOSIS — D696 Thrombocytopenia, unspecified: Secondary | ICD-10-CM | POA: Diagnosis present

## 2013-01-29 LAB — CBC
HCT: 33.2 % — ABNORMAL LOW (ref 36.0–46.0)
RDW: 13.1 % (ref 11.5–15.5)
WBC: 7.5 10*3/uL (ref 4.0–10.5)

## 2013-01-29 LAB — RPR: RPR Ser Ql: NONREACTIVE

## 2013-01-29 MED ORDER — PRENATAL MULTIVITAMIN CH
1.0000 | ORAL_TABLET | Freq: Every day | ORAL | Status: DC
  Administered 2013-01-30 – 2013-01-31 (×2): 1 via ORAL
  Filled 2013-01-29 (×2): qty 1

## 2013-01-29 MED ORDER — OXYTOCIN 40 UNITS IN LACTATED RINGERS INFUSION - SIMPLE MED
62.5000 mL/h | INTRAVENOUS | Status: DC
  Filled 2013-01-29: qty 1000

## 2013-01-29 MED ORDER — EPHEDRINE 5 MG/ML INJ
10.0000 mg | INTRAVENOUS | Status: DC | PRN
  Filled 2013-01-29: qty 2

## 2013-01-29 MED ORDER — LANOLIN HYDROUS EX OINT: TOPICAL_OINTMENT | CUTANEOUS | Status: DC | PRN

## 2013-01-29 MED ORDER — PHENYLEPHRINE 40 MCG/ML (10ML) SYRINGE FOR IV PUSH (FOR BLOOD PRESSURE SUPPORT)
PREFILLED_SYRINGE | INTRAVENOUS | Status: AC
  Filled 2013-01-29: qty 5

## 2013-01-29 MED ORDER — ACETAMINOPHEN 325 MG PO TABS: 650.0000 mg | ORAL_TABLET | ORAL | Status: DC | PRN

## 2013-01-29 MED ORDER — LACTATED RINGERS IV SOLN
INTRAVENOUS | Status: DC
  Administered 2013-01-29: 10:00:00 via INTRAVENOUS

## 2013-01-29 MED ORDER — LACTATED RINGERS IV SOLN
500.0000 mL | INTRAVENOUS | Status: DC | PRN
  Administered 2013-01-29: 1000 mL via INTRAVENOUS

## 2013-01-29 MED ORDER — LIDOCAINE HCL (PF) 1 % IJ SOLN
INTRAMUSCULAR | Status: DC | PRN
  Administered 2013-01-29 (×2): 5 mL

## 2013-01-29 MED ORDER — AMPICILLIN SODIUM 2 G IJ SOLR
2.0000 g | Freq: Once | INTRAMUSCULAR | Status: AC
  Administered 2013-01-29: 2 g via INTRAVENOUS
  Filled 2013-01-29: qty 2000

## 2013-01-29 MED ORDER — PHENYLEPHRINE 40 MCG/ML (10ML) SYRINGE FOR IV PUSH (FOR BLOOD PRESSURE SUPPORT)
80.0000 ug | PREFILLED_SYRINGE | INTRAVENOUS | Status: DC | PRN
  Filled 2013-01-29: qty 2

## 2013-01-29 MED ORDER — ONDANSETRON HCL 4 MG PO TABS: 4.0000 mg | ORAL_TABLET | ORAL | Status: DC | PRN

## 2013-01-29 MED ORDER — OXYTOCIN BOLUS FROM INFUSION
500.0000 mL | INTRAVENOUS | Status: DC
  Administered 2013-01-29: 500 mL via INTRAVENOUS

## 2013-01-29 MED ORDER — OXYCODONE-ACETAMINOPHEN 5-325 MG PO TABS: 1.0000 | ORAL_TABLET | ORAL | Status: DC | PRN

## 2013-01-29 MED ORDER — LACTATED RINGERS IV SOLN
500.0000 mL | Freq: Once | INTRAVENOUS | Status: AC
  Administered 2013-01-29: 500 mL via INTRAVENOUS

## 2013-01-29 MED ORDER — MEDROXYPROGESTERONE ACETATE 150 MG/ML IM SUSP
150.0000 mg | INTRAMUSCULAR | Status: DC | PRN
  Filled 2013-01-29: qty 1

## 2013-01-29 MED ORDER — IBUPROFEN 600 MG PO TABS
600.0000 mg | ORAL_TABLET | Freq: Four times a day (QID) | ORAL | Status: DC
  Administered 2013-01-29 – 2013-01-31 (×7): 600 mg via ORAL
  Filled 2013-01-29 (×2): qty 1

## 2013-01-29 MED ORDER — DIPHENHYDRAMINE HCL 50 MG/ML IJ SOLN: 12.5000 mg | INTRAMUSCULAR | Status: DC | PRN

## 2013-01-29 MED ORDER — SIMETHICONE 80 MG PO CHEW: 80.0000 mg | CHEWABLE_TABLET | ORAL | Status: DC | PRN

## 2013-01-29 MED ORDER — ONDANSETRON HCL 4 MG/2ML IJ SOLN: 4.0000 mg | INTRAMUSCULAR | Status: DC | PRN

## 2013-01-29 MED ORDER — LIDOCAINE HCL (PF) 1 % IJ SOLN
30.0000 mL | INTRAMUSCULAR | Status: DC | PRN
  Filled 2013-01-29: qty 30

## 2013-01-29 MED ORDER — DIPHENHYDRAMINE HCL 25 MG PO CAPS: 25.0000 mg | ORAL_CAPSULE | Freq: Four times a day (QID) | ORAL | Status: DC | PRN

## 2013-01-29 MED ORDER — DIBUCAINE 1 % RE OINT
1.0000 "application " | TOPICAL_OINTMENT | RECTAL | Status: DC | PRN
  Administered 2013-01-29: 1 via RECTAL
  Filled 2013-01-29: qty 28

## 2013-01-29 MED ORDER — OXYCODONE-ACETAMINOPHEN 5-325 MG PO TABS
1.0000 | ORAL_TABLET | ORAL | Status: DC | PRN
  Administered 2013-01-29: 1 via ORAL
  Administered 2013-01-30: 2 via ORAL
  Administered 2013-01-30 – 2013-01-31 (×3): 1 via ORAL
  Filled 2013-01-29: qty 2
  Filled 2013-01-29 (×4): qty 1

## 2013-01-29 MED ORDER — CITRIC ACID-SODIUM CITRATE 334-500 MG/5ML PO SOLN: 30.0000 mL | ORAL | Status: DC | PRN

## 2013-01-29 MED ORDER — ZOLPIDEM TARTRATE 5 MG PO TABS: 5.0000 mg | ORAL_TABLET | Freq: Every evening | ORAL | Status: DC | PRN

## 2013-01-29 MED ORDER — BENZOCAINE-MENTHOL 20-0.5 % EX AERO
1.0000 "application " | INHALATION_SPRAY | CUTANEOUS | Status: DC | PRN
  Administered 2013-01-29: 1 via TOPICAL
  Filled 2013-01-29: qty 56

## 2013-01-29 MED ORDER — EPHEDRINE 5 MG/ML INJ
INTRAVENOUS | Status: AC
  Filled 2013-01-29: qty 4

## 2013-01-29 MED ORDER — SENNOSIDES-DOCUSATE SODIUM 8.6-50 MG PO TABS
2.0000 | ORAL_TABLET | Freq: Every day | ORAL | Status: DC
  Administered 2013-01-29 – 2013-01-30 (×2): 2 via ORAL

## 2013-01-29 MED ORDER — WITCH HAZEL-GLYCERIN EX PADS
1.0000 "application " | MEDICATED_PAD | CUTANEOUS | Status: DC | PRN
  Administered 2013-01-29: 1 via TOPICAL

## 2013-01-29 MED ORDER — ONDANSETRON HCL 4 MG/2ML IJ SOLN: 4.0000 mg | Freq: Four times a day (QID) | INTRAMUSCULAR | Status: DC | PRN

## 2013-01-29 MED ORDER — TETANUS-DIPHTH-ACELL PERTUSSIS 5-2.5-18.5 LF-MCG/0.5 IM SUSP
0.5000 mL | Freq: Once | INTRAMUSCULAR | Status: AC
  Administered 2013-01-31: 0.5 mL via INTRAMUSCULAR
  Filled 2013-01-29 (×2): qty 0.5

## 2013-01-29 MED ORDER — FENTANYL 2.5 MCG/ML BUPIVACAINE 1/10 % EPIDURAL INFUSION (WH - ANES)
INTRAMUSCULAR | Status: AC
  Filled 2013-01-29: qty 125

## 2013-01-29 MED ORDER — IBUPROFEN 600 MG PO TABS
600.0000 mg | ORAL_TABLET | Freq: Four times a day (QID) | ORAL | Status: DC | PRN
  Administered 2013-01-30: 600 mg via ORAL
  Filled 2013-01-29 (×7): qty 1

## 2013-01-29 MED ORDER — FENTANYL 2.5 MCG/ML BUPIVACAINE 1/10 % EPIDURAL INFUSION (WH - ANES)
14.0000 mL/h | INTRAMUSCULAR | Status: DC | PRN
  Administered 2013-01-29: 14 mL/h via EPIDURAL

## 2013-01-29 NOTE — MAU Note (Signed)
Patient states she is having frequent contractions. Denies bleeding or leaking and reports good fetal movement.  

## 2013-01-29 NOTE — Progress Notes (Signed)
UR chart review completed.  

## 2013-01-29 NOTE — MAU Note (Signed)
Report given to Raney, CN/BS. Room number received.

## 2013-01-29 NOTE — Anesthesia Preprocedure Evaluation (Signed)

## 2013-01-29 NOTE — H&P (Signed)
Jamie Wright is a 27 y.o. female, [phone] at [redacted]w[redacted]d, presenting for active labor.  Denies LOF, HA, visual changes.  Patient Active Problem List  Diagnosis  . Pregnant state, incidental  . Prior pregnancy with placenta abruption, antepartum  . History of ectopic pregnancy  . Preterm labor second trimester with preterm delivery third trimester  . Preterm delivery  . Anomaly of umbilical cord  . Preterm labor  . False labor    History of present pregnancy: Patient entered care at 11 weeks.   EDC of 02/11/13 was established by LMP.   Anatomy scan:  [redacted]w[redacted]d, with normal LIMITED findings and an anterior placenta.   Additional [place] evaluations:  22w f/u anatomy - spine WNL, Hypocoiled cord noted; multiple U/S throughout pregnancy  Significant prenatal events:  + FFN 2/24; PTL; Long prodromal latent phase   Last evaluation:  01/28/13 in MAU   OB History   Grav Para Term Preterm Abortions TAB SAB Ect Mult Living   4 2 1 1 1  0 0 1 0 2     Past Medical History  Diagnosis Date  . UTI (urinary tract infection)   . Ectopic pregnancy   . Spinal headache     ? bad migraines following delivery  . Irregular heartbeat 2009    Hx of Echo done  . Preterm labor 2008    Late 2nd or Early 3rd trimester PTL, injection given to stop contractions, was d/c home w/ meds  . Preterm labor 2009    Preterm birth @ 32 wks; Abrupted placenta  . Preterm labor 2009    Prior to delivery of 2nd child;had contractions frequently  . Infection     BV;not frequent  . Infection     UTI;not frequent   Past Surgical History  Procedure Laterality Date  . No past surgeries     Family History: family history includes Cancer in her maternal aunt; Cerebral palsy in her sister; and Other in her maternal grandfather and mother.  There is no history of Hearing loss.  Social History:  reports that she has never smoked. She has never used smokeless tobacco. She reports that she does not drink alcohol or use illicit drugs.  Pt's father at [facility] now, FOB on his way from work in [place].     Prenatal Transfer Tool  Maternal Diabetes: No Genetic Screening: Normal Maternal Ultrasounds/Referrals: Normal Fetal Ultrasounds or other Referrals:  None Maternal Substance Abuse:  No Significant Maternal Medications:  Meds include: Zoloft; 17 P Significant Maternal Lab Results: Lab values include: Group B Strep positive, Other: + FFN on 2/24    ROS:  See HPI, all other systems are negative  No Known Allergies   Dilation: 5 Effacement (%): 90 Station: 0 Exam by:: Carolin Sicks, RN  Blood pressure 113/81, pulse 80, temperature 97.6 F (36.4 C), temperature source Oral, resp. rate 20, last menstrual period 05/07/2012, SpO2 100.00%.  Chest clear Heart RRR without murmur Abd gravid, NT, FH 38 Pelvic: cx exam done by RN in MAU, deferred until epidural Ext: WNL  FHR: reassuring, Variable x 1 @ 1000 unsure if its maternal pulse; will continue to assess UCs:  2-3 min  Prenatal labs: ABO, Rh: O/POS/-- (09/30 1533) Antibody: NEG (09/30 1533) Rubella:   Immune RPR: NON REAC (01/17 1032)  HBsAg: NEGATIVE (09/30 1533)  HIV: NON REACTIVE (09/30 1533)  GBS: POSITIVE (03/21 1001) Sickle cell/Hgb electrophoresis:  n/a Pap:  unknown GC:  Neg Chlamydia:  Neg Genetic screenings:  WNL Glucola:  102 Other:  FFN, see above  Assessment/Plan: IUP at [redacted]w[redacted]d Active labor Wants epidural GBS + H/o rapid labor Hypocoiled cord noted during pregnancy  Admit to L&D per c/w Dr, Pennie Rushing Ampicillin 2g Once for GBS prophylaxis Epidural  Assess labor progress    [name], MSN 01/29/2013, 9:44 AM

## 2013-01-29 NOTE — Anesthesia Postprocedure Evaluation (Signed)
  Anesthesia Post Note  Patient: Jamie Wright  Procedure(s) Performed: * No procedures listed *  Anesthesia type: Epidural  Patient location: Mother/Baby  Post pain: Pain level controlled  Post assessment: Post-op Vital signs reviewed  Last Vitals:  Filed Vitals:   01/29/13 1622  BP: 119/72  Pulse: 61  Temp: 36.7 C  Resp: 16    Post vital signs: Reviewed  Level of consciousness: awake  Complications: No apparent anesthesia complications

## 2013-01-29 NOTE — Anesthesia Procedure Notes (Signed)
Epidural Patient location during procedure: OB Start time: 01/29/2013 10:11 AM  Staffing Anesthesiologist: Angus Seller., Harrell Gave. Performed by: anesthesiologist   Preanesthetic Checklist Completed: patient identified, site marked, surgical consent, pre-op evaluation, timeout performed, IV checked, risks and benefits discussed and monitors and equipment checked  Epidural Patient position: sitting Prep: site prepped and draped and DuraPrep Patient monitoring: continuous pulse ox and blood pressure Approach: midline Injection technique: LOR air and LOR saline  Needle:  Needle type: Tuohy  Needle gauge: 17 G Needle length: 9 cm and 9 Needle insertion depth: 5 cm cm Catheter type: closed end flexible Catheter size: 19 Gauge Catheter at skin depth: 10 cm Test dose: negative  Assessment Events: blood not aspirated, injection not painful, no injection resistance, negative IV test and no paresthesia  Additional Notes Patient identified.  Risk benefits discussed including failed block, incomplete pain control, headache, nerve damage, paralysis, blood pressure changes, nausea, vomiting, reactions to medication both toxic or allergic, and postpartum back pain.  Patient expressed understanding and wished to proceed.  All questions were answered.  Sterile technique used throughout procedure and epidural site dressed with sterile barrier dressing. No paresthesia or other complications noted.The patient did not experience any signs of intravascular injection such as tinnitus or metallic taste in mouth nor signs of intrathecal spread such as rapid motor block. Please see nursing notes for vital signs.

## 2013-01-29 NOTE — Progress Notes (Signed)
Pt. Admitted to labor and delivery room 163 in active labor requesting an epidural. She is rating her pain 10/10 and writhing in the bed.

## 2013-01-30 LAB — CBC
HCT: 27.6 % — ABNORMAL LOW (ref 36.0–46.0)
MCH: 30.4 pg (ref 26.0–34.0)
MCV: 89.3 fL (ref 78.0–100.0)
RDW: 13 % (ref 11.5–15.5)
WBC: 9.2 10*3/uL (ref 4.0–10.5)

## 2013-01-30 MED ORDER — MEDROXYPROGESTERONE ACETATE 150 MG/ML IM SUSP
150.0000 mg | Freq: Once | INTRAMUSCULAR | Status: AC
  Administered 2013-01-30: 150 mg via INTRAMUSCULAR

## 2013-01-30 NOTE — Progress Notes (Signed)
Post Partum Day 1:S/P SVB, intact perineum Subjective: Patient up ad lib, denies syncope or dizziness.  Doing very well, very happy with delivery Feeding:  Breast Contraceptive plan:   DepoProvera at d/c  Objective: Blood pressure 101/66, pulse 55, temperature 97.4 F (36.3 C), temperature source Oral, resp. rate 16, height 5\' 2"  (1.575 m), weight 142 lb (64.411 kg), last menstrual period 05/07/2012, SpO2 98.00%, unknown if currently breastfeeding.  Physical Exam:  General: alert Lochia: appropriate Uterine Fundus: firm Incision: Perineum intact DVT Evaluation: No evidence of DVT seen on physical exam. Negative Homan's sign.   Recent Labs  01/29/13 0938 01/30/13 0625  HGB 11.1* 9.4*  HCT 33.2* 27.6*  Orthostatics stable.  Assessment/Plan: S/P Vaginal delivery day 1 Continue current care Plan for discharge tomorrow DepoProvera tomorrow.   LOS: 1 day   Findley Vi, Chip Boer 01/30/2013, 10:24 AM

## 2013-01-30 NOTE — Anesthesia Postprocedure Evaluation (Signed)
  Anesthesia Post-op Note  Patient: Jamie Wright  Procedure(s) Performed: * No procedures listed *  Patient Location: Mother/Baby  Anesthesia Type:Epidural  Level of Consciousness: awake, alert  and oriented  Airway and Oxygen Therapy: Patient Spontanous Breathing  Post-op Pain: mild  Post-op Assessment: Patient's Cardiovascular Status Stable, Respiratory Function Stable, Patent Airway, No signs of Nausea or vomiting, Pain level controlled, No headache, No residual numbness and No residual motor weakness  Post-op Vital Signs: Reviewed and stable  Complications: No apparent anesthesia complications

## 2013-01-31 MED ORDER — FERROUS SULFATE 325 (65 FE) MG PO TABS: 325.0000 mg | ORAL_TABLET | Freq: Two times a day (BID) | ORAL | Status: DC

## 2013-01-31 MED ORDER — IBUPROFEN 800 MG PO TABS: 800.0000 mg | ORAL_TABLET | Freq: Three times a day (TID) | ORAL | Status: DC | PRN

## 2013-01-31 MED ORDER — OXYCODONE-ACETAMINOPHEN 5-325 MG PO TABS: 1.0000 | ORAL_TABLET | ORAL | Status: DC | PRN

## 2013-01-31 MED ORDER — MEDROXYPROGESTERONE ACETATE 150 MG/ML IM SUSP: 150.0000 mg | INTRAMUSCULAR | Status: DC

## 2013-01-31 NOTE — Discharge Summary (Signed)
Vaginal Delivery Discharge Summary  Jamie Wright  DOB:    1986/07/22 MRN:    [id number] CSN:    [id number]  Date of admission:                  01/29/2013  Date of discharge:                   01/31/2013  Procedures this admission:  Date of Delivery: 01/29/2013 normal spontaneous vaginal delivery by Haroldine Laws, certified nurse midwife  Newborn Data:  Live born female  Birth Weight: 7 lb 10 oz (3459 g) APGAR: 7, 9  Home with mother. Name: Jamie Wright  History of Present Illness:  Ms. Jamie Wright is a 27 y.o. female, [phone], who presents at [redacted]w[redacted]d weeks gestation. The patient has been followed at the [facility] of [facility]. She was admitted onset of labor. Her pregnancy has been complicated by: hypocoiled umbilical cord, depression requiring Zoloft, positive beta strep, history of preterm labor requiring 17P.  Hospital course:  The patient was admitted for labor.   Her labor was not complicated. She proceeded to have a vaginal delivery of a healthy infant. Her delivery was not complicated. Her postpartum course was not complicated. She was discharged to home on postpartum day 2 doing well. She received Depo-Provera prior to discharge. Her platelet count after delivery was 118,000.  Feeding:  breast  Contraception:  Depo-Provera  Discharge hemoglobin:  Hemoglobin  Date Value Range Status  01/30/2013 9.4* 12.0 - 15.0 g/dL Final     HCT  Date Value Range Status  01/30/2013 27.6* 36.0 - 46.0 % Final    Discharge Physical Exam:   General: no distress Lochia: appropriate Uterine Fundus: firm Incision: NA DVT Evaluation: No evidence of DVT seen on physical exam.  Intrapartum Procedures: spontaneous vaginal delivery Postpartum Procedures: none Complications-Operative and Postpartum: none  Discharge Diagnoses: Term Pregnancy-delivered and hypocoiled umbilical cord, positive beta strep, depression,  anemia, history of preterm labor with prior pregnancy requiring 17P, thrombocytopenia.  Discharge Information:  Activity:           pelvic rest Diet:                routine Medications: PNV, Ibuprofen, Iron, Percocet and Zoloft. Condition:      stable and improved Instructions:  Postpartum depression and care after vaginal delivery. Discharge to: home  Follow-up Information   Follow up with [facility] In 6 weeks.   Contact information:   [address], [address] [place] [address]        [name] 01/31/2013

## 2013-02-10 ENCOUNTER — Ambulatory Visit (HOSPITAL_COMMUNITY): Payer: Medicaid Other

## 2013-02-10 ENCOUNTER — Ambulatory Visit (HOSPITAL_COMMUNITY): Admission: RE | Admit: 2013-02-10 | Payer: Medicaid Other | Source: Ambulatory Visit

## 2013-02-11 ENCOUNTER — Ambulatory Visit (HOSPITAL_COMMUNITY): Admission: RE | Admit: 2013-02-11 | Payer: Medicaid Other | Source: Ambulatory Visit

## 2013-10-14 IMAGING — US US OB COMP LESS 14 WK
1 series · 13 of 28 positions shown · non-contrast
Comparison: none

[Series 1: us ob comp less 14 wks · 13 of 44 slices shown]
[im 2/44]
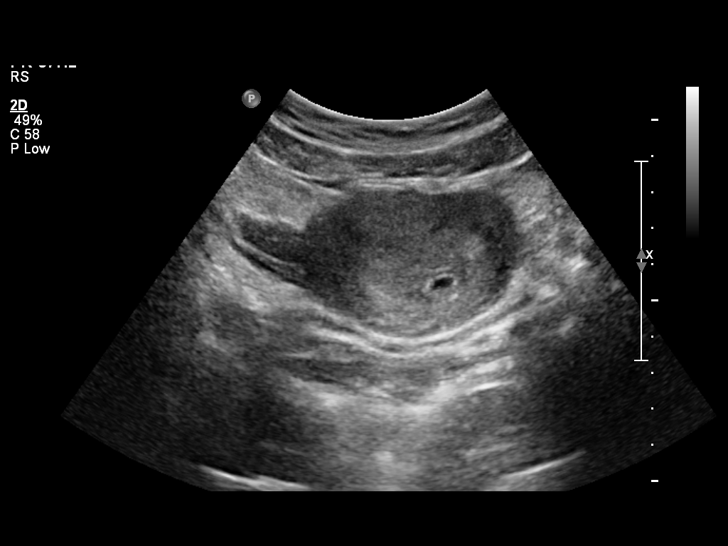
[im 5/44]
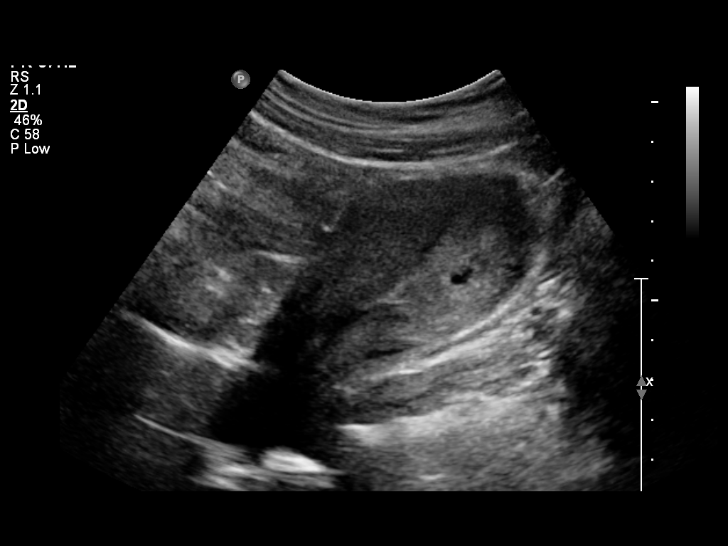
[im 8/44]
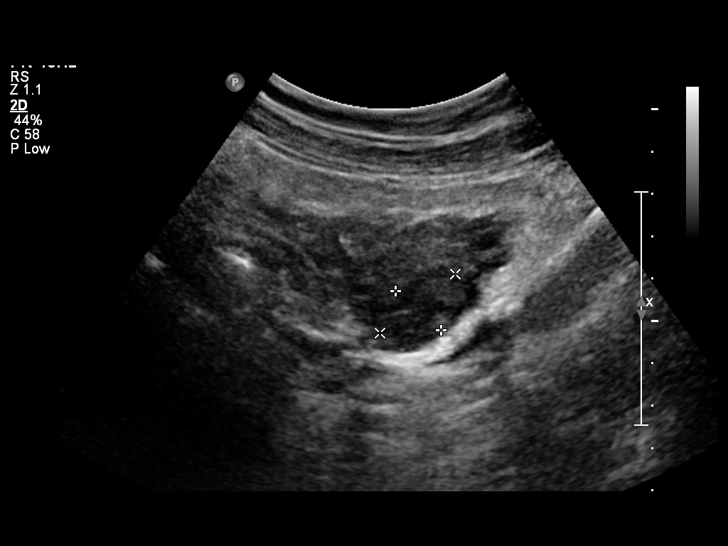
[im 12/44]
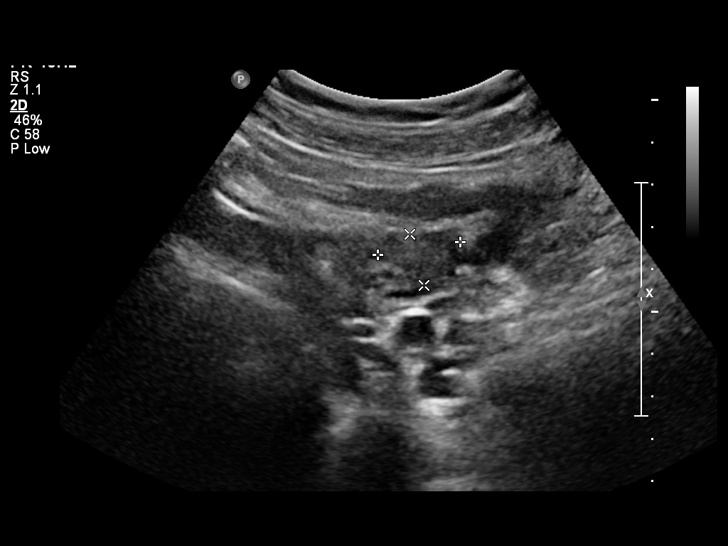
[im 15/44]
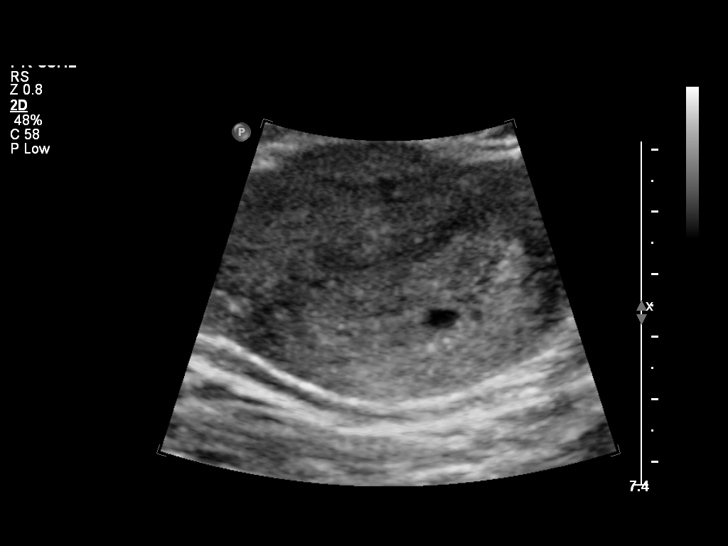
[im 18/44]
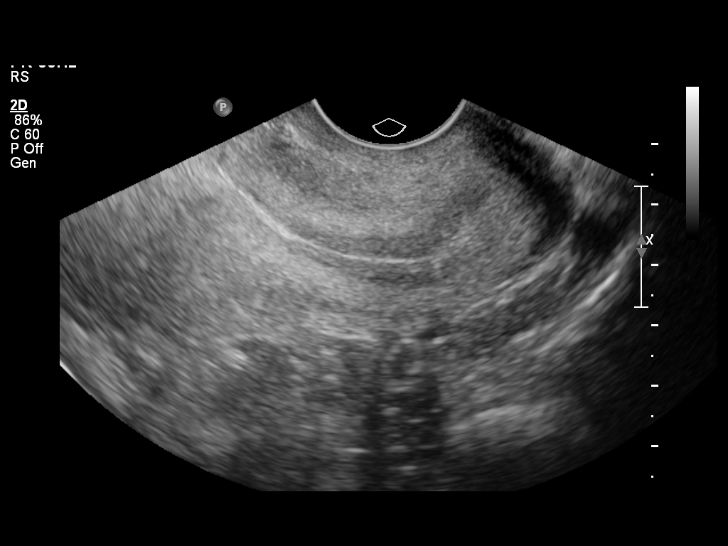
[im 23/44]
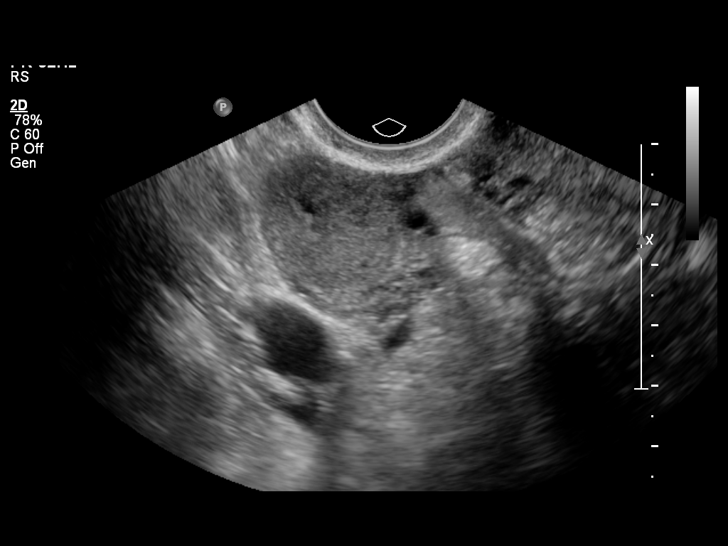
[im 26/44]
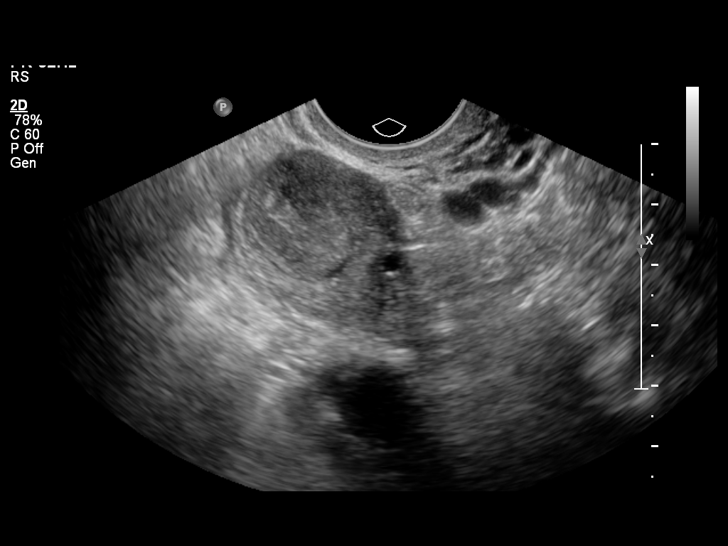
[im 29/44]
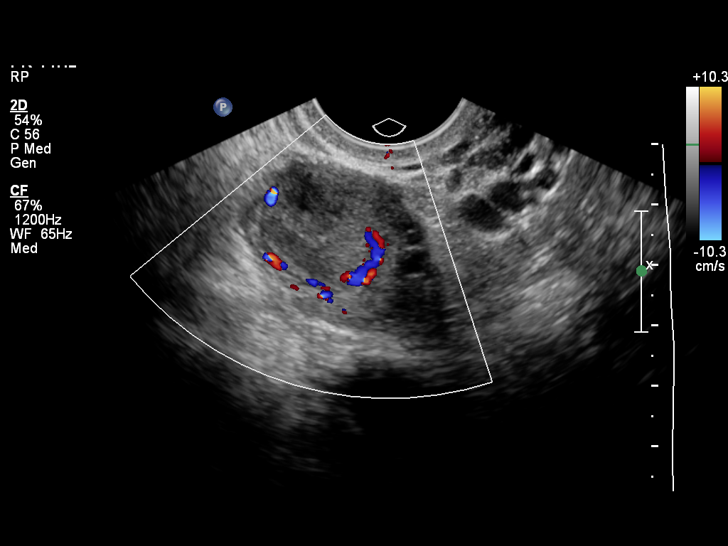
[im 32/44]
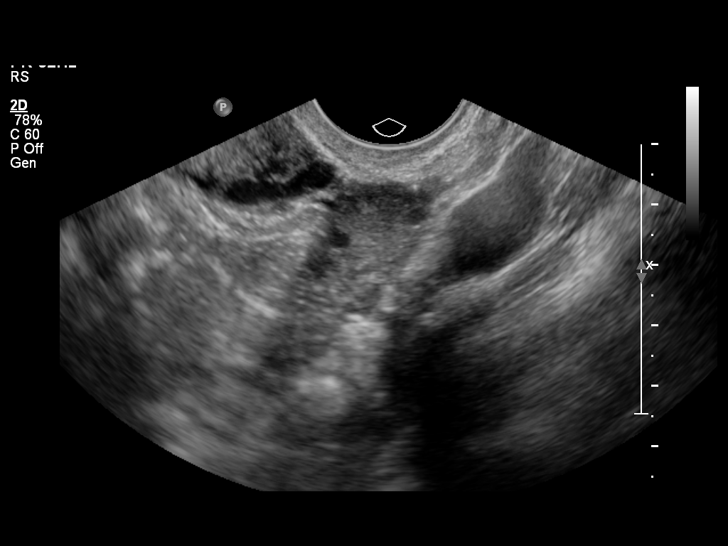
[im 36/44]
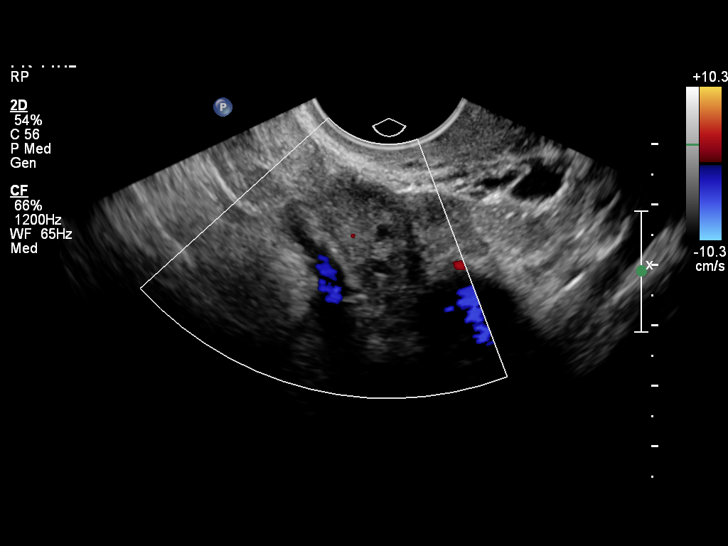
[im 39/44]
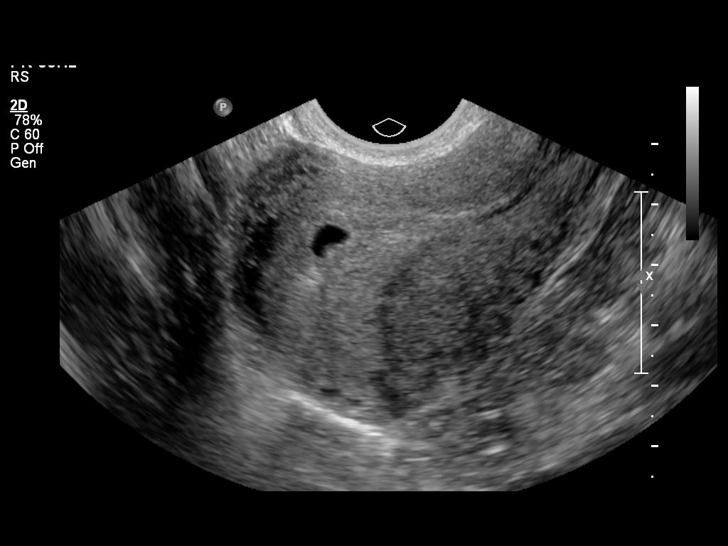
[im 42/44]
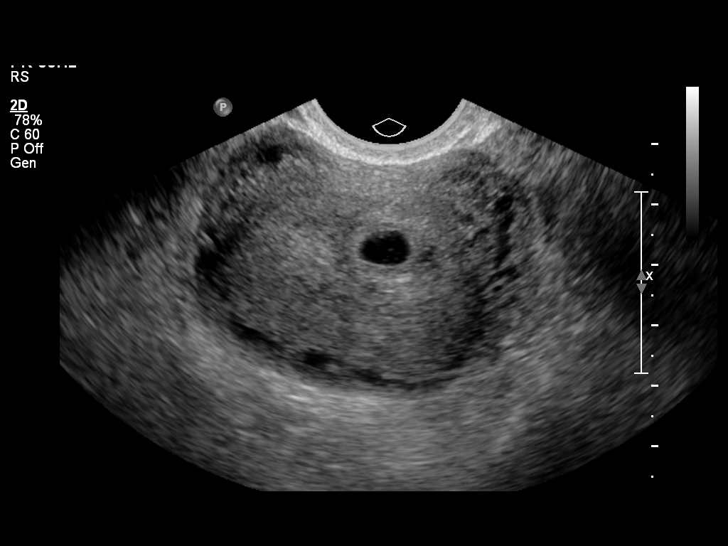

[13 of 28 positions shown; findings below may reference images not displayed]

OBSTETRICS REPORT
                      (Signed Final 06/09/2012 [DATE])

                 28_E
Procedures

 US OB COMP LESS 14 WKS                                76801.0
 US OB TRANSVAGINAL                                    76817.0
Indications

 R/o ectopic pregnancy (also use pelvic pain or
 threatened ab, poor Ob hx)
Fetal Evaluation

 Preg. Location:    Intrauterine
 Gest. Sac:         Intrauterine
 Yolk Sac:          Not visualized
 Fetal Pole:        Not visualized
 Cardiac Activity:  No embryo visualized
Biometry

 GS:       5.5  mm    G. Age:   5w 0d                  EDD:   02/09/13
Gestational Age

 LMP:           4w 5d        Date:   05/07/12                 EDD:   02/11/13
 Best:          4w 5d     Det. By:   LMP  (05/07/12)          EDD:   02/11/13
Cervix Uterus Adnexa

 Cervix:       Closed.
 Uterus:       No abnormality visualized.
 Left Ovary:   Within normal limits.7.7x7.6x7.5cm
 Right Ovary:  Within normal limits.3.8x5.7x5.8cm- Small corpus
               luteum noted.
 Adnexa:     No abnormality visualized.
Comments

  No Bhcg level for correlation.
Impression

 There is a 4w5d intrauterine gestational sac.  The MSD is
 concordant with the EGA by LMP.  A fetal pole and yolk sac
 are not seen but are not yet expected at this early gestational
 age. Continued follow up with US and/or serial Bhcg levels is
 recommended to document viability.

 questions or concerns.

## 2013-11-06 IMAGING — US US OB TRANSVAGINAL
1 series · 13 of 27 positions shown · non-contrast
Comparison: none

[Series 1: us ob transvaginal · 27 acquisitions, 13 frames shown]
[im 2/27]
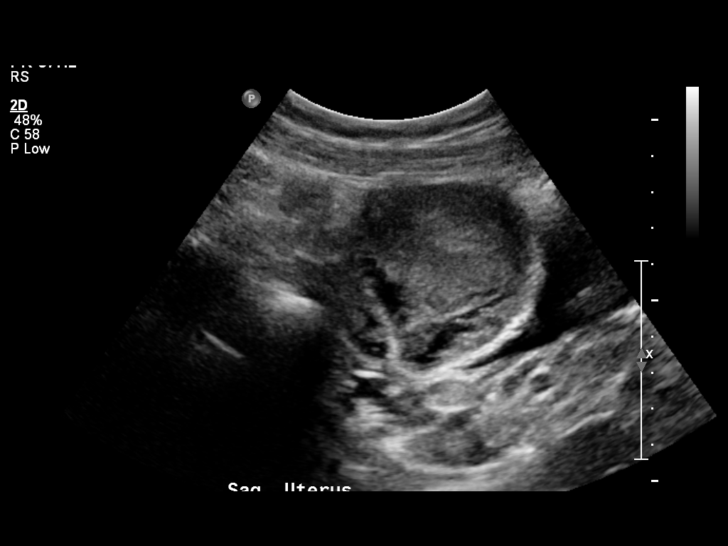
[im 4/27]
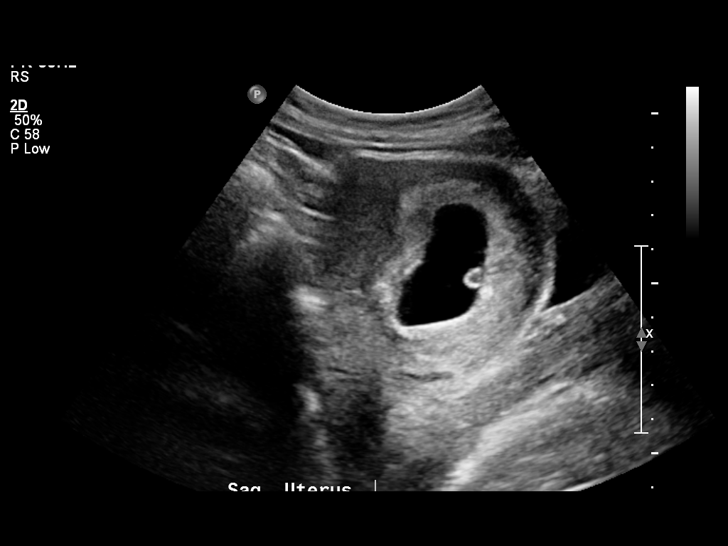
[im 6/27]
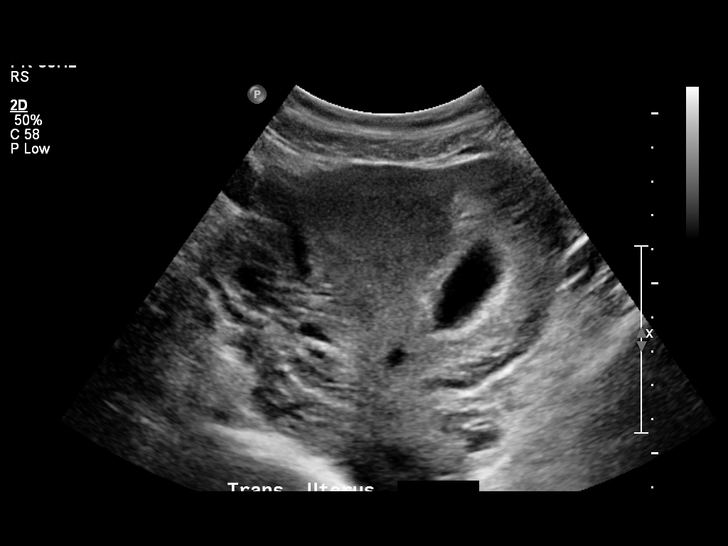
[im 8/27]
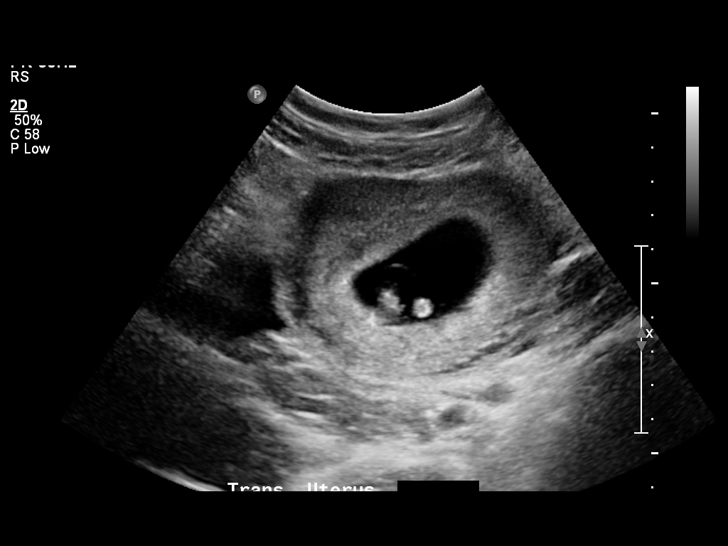
[im 10/27]
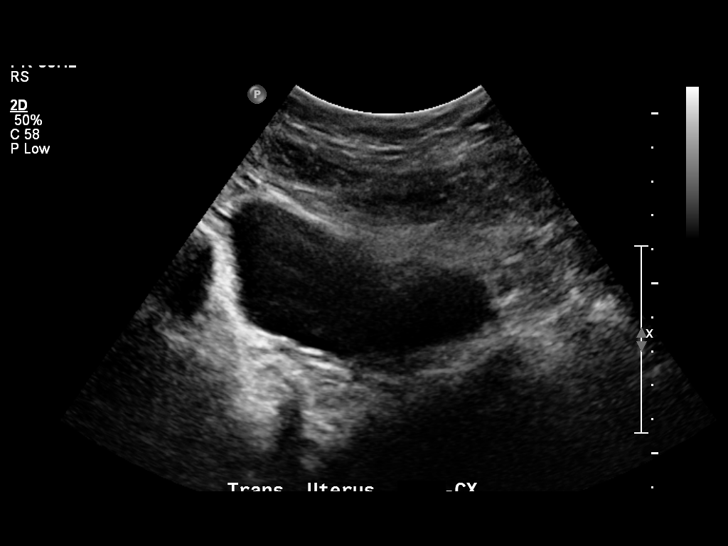
[im 12/27]
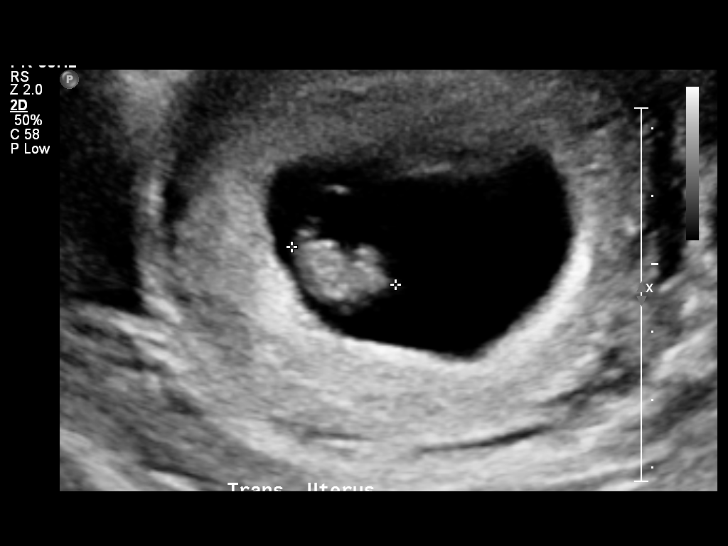
[im 14/27]
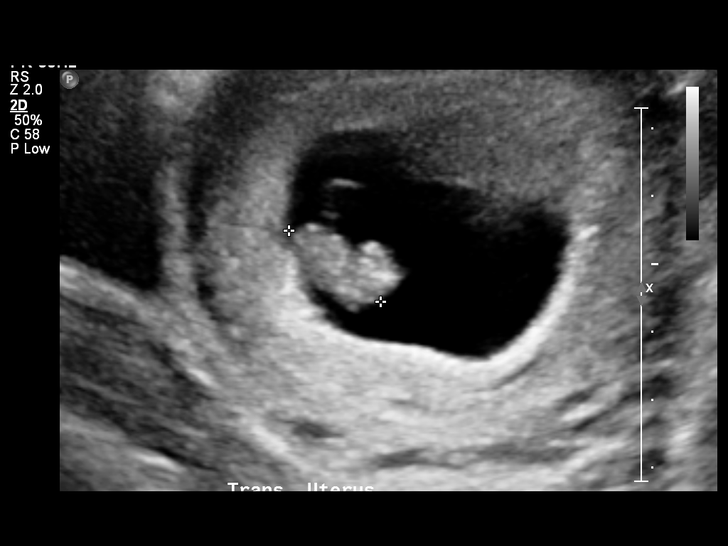
[im 16/27]
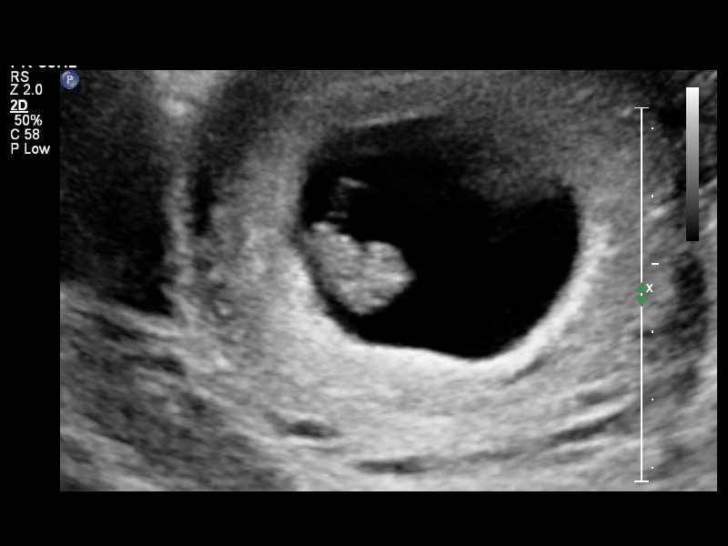
[im 18/27]
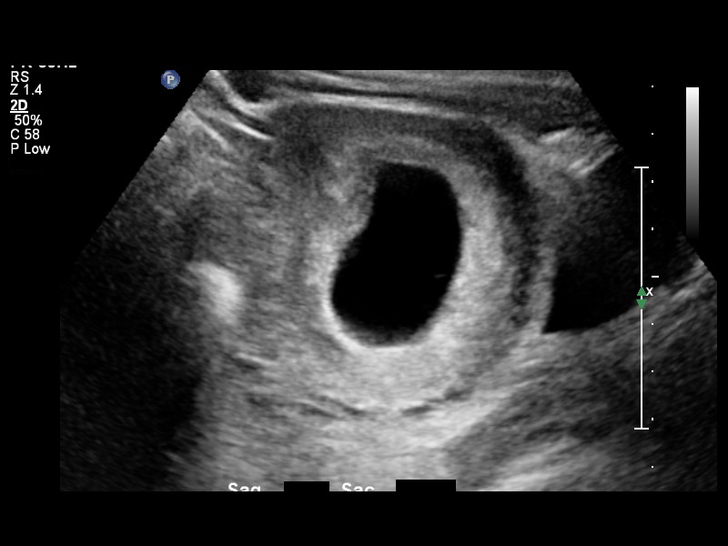
[im 20/27]
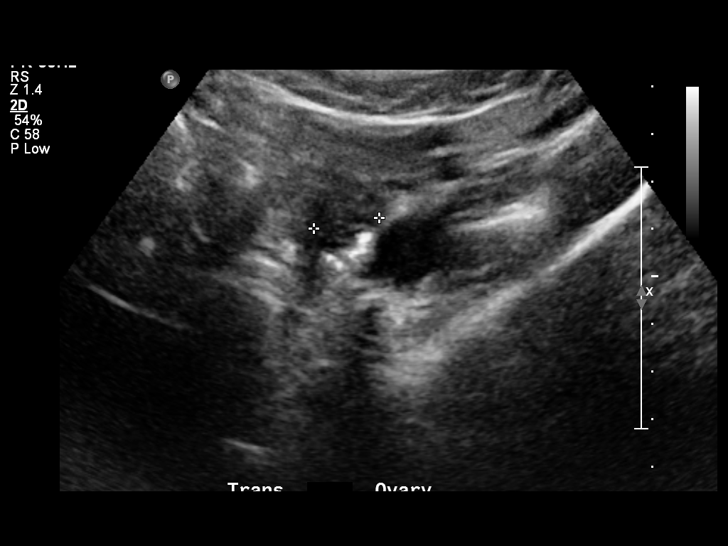
[im 22/27]
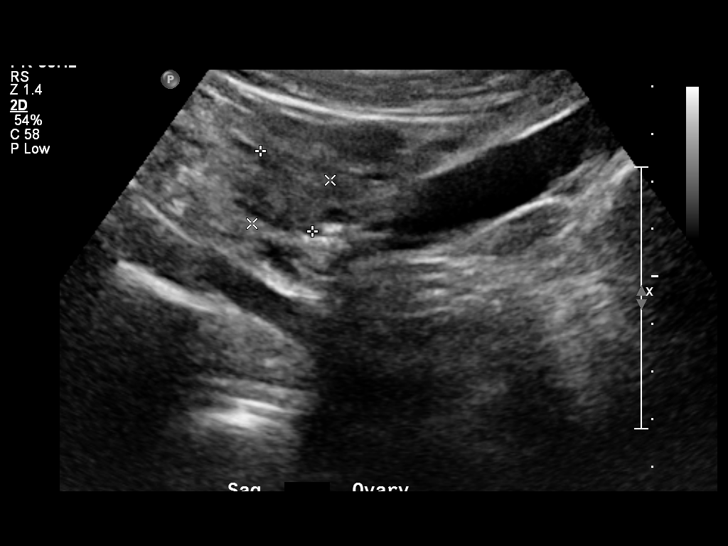
[im 24/27]
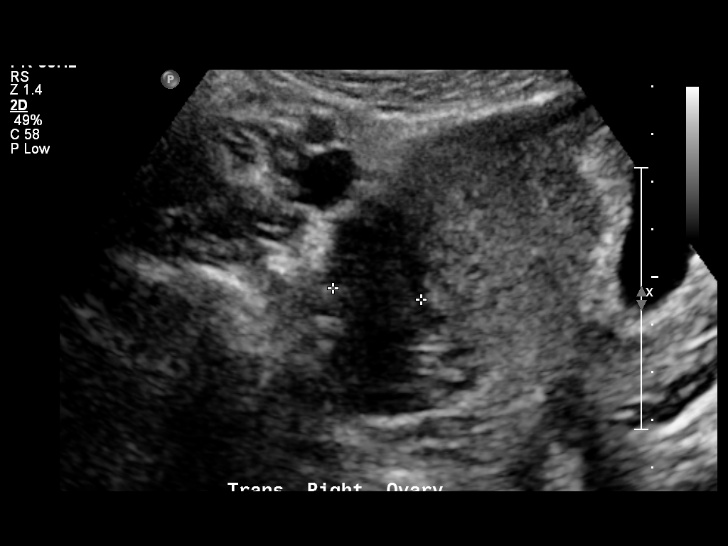
[im 26/27]
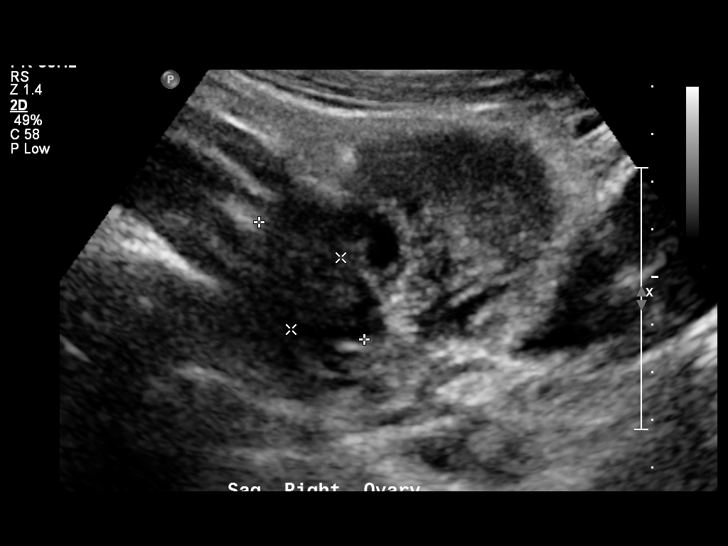

[13 of 27 positions shown; findings below may reference images not displayed]

OBSTETRICS REPORT
                      (Signed Final 07/02/2012 [DATE])

Procedures

 US OB TRANSVAGINAL                                    76817.0
Indications

 Fall; abdomen/pelvic injury and pain
 Abdominal pain - generalized
 Poor obstetric history: Previous preterm delivery
 (32 wks)
 Poor obstetrical history (prev Ectopic pregnancy)
Fetal Evaluation

 Preg. Location:    Intrauterine
 Gest. Sac:         Intrauterine
 Yolk Sac:          Visualized
 Fetal Pole:        Visualized
 Fetal Heart Rate:  163                         bpm
 Cardiac Activity:  Observed
Biometry

 CRL:     17.1  mm    G. Age:   8w 0d                  EDD:   02/11/13
Gestational Age

 LMP:           8w 0d        Date:   05/07/12                 EDD:   02/11/13
 Best:          8w 0d     Det. By:   LMP  (05/07/12)          EDD:   02/11/13
Cervix Uterus Adnexa

 Cervix:       Closed.
 Uterus:       No abnormality visualized. No subchorionic
               hemorrhage seen.
 Cul De Sac:   No free fluid seen.
 Left Ovary:   Within normal limits measuring 1.4 x 2.0 x 1.9 cm.
 Right Ovary:  Within normal limits measuring 3.3 x 1.8 x 1.9 cm.
 Adnexa:     No abnormality visualized.
Impression

 There is a single living intrauterine pregancy demonstrating
 an EGA by CRL of  8w 0d. This correlates well with expected
 EGA by of 8w 0d  .
 Normal ovaries.

 questions or concerns.

## 2014-03-23 ENCOUNTER — Emergency Department (HOSPITAL_BASED_OUTPATIENT_CLINIC_OR_DEPARTMENT_OTHER)
Admission: EM | Admit: 2014-03-23 | Discharge: 2014-03-23 | Disposition: A | Discharge status: Home / Self Care | Payer: Medicaid Other | Attending: Emergency Medicine | Admitting: Emergency Medicine

## 2014-03-23 ENCOUNTER — Encounter (HOSPITAL_BASED_OUTPATIENT_CLINIC_OR_DEPARTMENT_OTHER): Payer: Self-pay | Admitting: Emergency Medicine

## 2014-03-23 DIAGNOSIS — Z8679 Personal history of other diseases of the circulatory system: Secondary | ICD-10-CM | POA: Insufficient documentation

## 2014-03-23 DIAGNOSIS — L0390 Cellulitis, unspecified: Secondary | ICD-10-CM

## 2014-03-23 DIAGNOSIS — Z792 Long term (current) use of antibiotics: Secondary | ICD-10-CM | POA: Insufficient documentation

## 2014-03-23 DIAGNOSIS — L02419 Cutaneous abscess of limb, unspecified: Secondary | ICD-10-CM | POA: Insufficient documentation

## 2014-03-23 DIAGNOSIS — Z8669 Personal history of other diseases of the nervous system and sense organs: Secondary | ICD-10-CM | POA: Insufficient documentation

## 2014-03-23 DIAGNOSIS — L03119 Cellulitis of unspecified part of limb: Principal | ICD-10-CM

## 2014-03-23 DIAGNOSIS — Z8619 Personal history of other infectious and parasitic diseases: Secondary | ICD-10-CM | POA: Insufficient documentation

## 2014-03-23 DIAGNOSIS — Z23 Encounter for immunization: Secondary | ICD-10-CM | POA: Insufficient documentation

## 2014-03-23 DIAGNOSIS — Z791 Long term (current) use of non-steroidal anti-inflammatories (NSAID): Secondary | ICD-10-CM | POA: Insufficient documentation

## 2014-03-23 DIAGNOSIS — Z8751 Personal history of pre-term labor: Secondary | ICD-10-CM | POA: Insufficient documentation

## 2014-03-23 DIAGNOSIS — Z87440 Personal history of urinary (tract) infections: Secondary | ICD-10-CM | POA: Insufficient documentation

## 2014-03-23 MED ORDER — SULFAMETHOXAZOLE-TRIMETHOPRIM 800-160 MG PO TABS: 1.0000 | ORAL_TABLET | Freq: Two times a day (BID) | ORAL | Status: AC

## 2014-03-23 MED ORDER — TETANUS-DIPHTH-ACELL PERTUSSIS 5-2.5-18.5 LF-MCG/0.5 IM SUSP
0.5000 mL | Freq: Once | INTRAMUSCULAR | Status: AC
  Administered 2014-03-23: 0.5 mL via INTRAMUSCULAR
  Filled 2014-03-23: qty 0.5

## 2014-03-23 NOTE — ED Notes (Signed)
Pt c/o ? Insect bite to back of left thigh x 3 days

## 2014-03-23 NOTE — Discharge Instructions (Signed)
Cellulitis Cellulitis is an infection of the skin and the tissue beneath it. The infected area is usually red and tender. Cellulitis occurs most often in the arms and lower legs.  CAUSES  Cellulitis is caused by bacteria that enter the skin through cracks or cuts in the skin. The most common types of bacteria that cause cellulitis are Staphylococcus and Streptococcus. SYMPTOMS   Redness and warmth.  Swelling.  Tenderness or pain.  Fever. DIAGNOSIS  Your caregiver can usually determine what is wrong based on a physical exam. Blood tests may also be done. TREATMENT  Treatment usually involves taking an antibiotic medicine. HOME CARE INSTRUCTIONS   Take your antibiotics as directed. Finish them even if you start to feel better.  Keep the infected arm or leg elevated to reduce swelling.  Apply a warm cloth to the affected area up to 4 times per day to relieve pain.  Only take over-the-counter or prescription medicines for pain, discomfort, or fever as directed by your caregiver.  Keep all follow-up appointments as directed by your caregiver. SEEK MEDICAL CARE IF:   You notice red streaks coming from the infected area.  Your red area gets larger or turns dark in color.  Your bone or joint underneath the infected area becomes painful after the skin has healed.  Your infection returns in the same area or another area.  You notice a swollen bump in the infected area.  You develop new symptoms. SEEK IMMEDIATE MEDICAL CARE IF:   You have a fever.  You feel very sleepy.  You develop vomiting or diarrhea.  You have a general ill feeling (malaise) with muscle aches and pains. MAKE SURE YOU:   Understand these instructions.  Will watch your condition.  Will get help right away if you are not doing well or get worse. Document Released: 07/17/2005 Document Revised: 04/07/2012 Document Reviewed: 12/23/2011 Exit[employer] Patient Information 2014 [employer].  

## 2014-03-23 NOTE — ED Provider Notes (Signed)
CSN: [id number]     Arrival date & time 03/23/14  1602 History   First MD Initiated Contact with Patient 03/23/14 1711     Chief Complaint  Patient presents with  . Insect Bite     (Consider location/radiation/quality/duration/timing/severity/associated sxs/prior Treatment) HPI Comments: Patient complains of a three-day history of worsening redness to her left thigh. She states it started 3 days ago with a small pimple. She's had a pimple opened and since that time she's had an increased area of redness and soreness. There's no itching. She denies any fevers chills or vomiting. She denies any past known skin infections.   Past Medical History  Diagnosis Date  . UTI (urinary tract infection)   . Ectopic pregnancy   . Spinal headache     ? bad migraines following delivery  . Irregular heartbeat 2009    Hx of Echo done  . Preterm labor 2008    Late 2nd or Early 3rd trimester PTL, injection given to stop contractions, was d/c home w/ meds  . Preterm labor 2009    Preterm birth @ 32 wks; Abrupted placenta  . Preterm labor 2009    Prior to delivery of 2nd child;had contractions frequently  . Infection     BV;not frequent  . Infection     UTI;not frequent   Past Surgical History  Procedure Laterality Date  . No past surgeries     Family History  Problem Relation Age of Onset  . Hearing loss Neg Hx   . Other Mother     Charcot Byrd Hesselbach tooth  . Other Maternal Grandfather     Charcot Maria tooth  . Cancer Maternal Aunt     Breast;before menopause  . Cerebral palsy Sister     Twins;1/2 sisters   History  Substance Use Topics  . Smoking status: Never Smoker   . Smokeless tobacco: Never Used  . Alcohol Use: No   OB History   Grav Para Term Preterm Abortions TAB SAB Ect Mult Living   4 3 2 1 1  0 0 1 0 3     Review of Systems  Constitutional: Negative for fever.  Gastrointestinal: Negative for nausea and vomiting.  Musculoskeletal: Negative for arthralgias, back pain,  joint swelling and neck pain.  Skin: Positive for wound.  Neurological: Negative for weakness, numbness and headaches.      Allergies  Review of patient's allergies indicates no known allergies.  Home Medications   Prior to Admission medications   Medication Sig Start Date End Date Taking? Authorizing Provider  ibuprofen (ADVIL,MOTRIN) 800 MG tablet Take 1 tablet (800 mg total) by mouth every 8 (eight) hours as needed for pain. 01/31/13   Kirkland Hun, MD  sulfamethoxazole-trimethoprim (BACTRIM DS,SEPTRA DS) 800-160 MG per tablet Take 1 tablet by mouth 2 (two) times daily. 03/23/14 03/30/14  Rolan Bucco, MD   BP 111/52  Pulse 59  Temp(Src) 98.3 F (36.8 C) (Oral)  Resp 16  Ht 5\' 3"  (1.6 m)  Wt 127 lb (57.607 kg)  BMI 22.50 kg/m2  SpO2 99% Physical Exam  Constitutional: She is oriented to person, place, and time. She appears well-developed and well-nourished.  HENT:  Head: Normocephalic and atraumatic.  Neck: Normal range of motion. Neck supple.  Cardiovascular: Normal rate.   Pulmonary/Chest: Effort normal.  Musculoskeletal: She exhibits no edema and no tenderness.  Neurological: She is alert and oriented to person, place, and time.  Skin: Skin is warm and dry.  There is a 3 cm  circular area redness to the posterior aspect of the left thigh. There's 2 punctate blisters in the central area of the redness. There is no underlying induration or fluctuance.  Psychiatric: She has a normal mood and affect.    ED Course  Procedures (including critical care time) Labs Review Labs Reviewed - No data to display  Imaging Review No results found.   EKG Interpretation None      MDM   Final diagnoses:  Cellulitis    Patient likely with a small area of cellulitis. There's no underlying abscess. Given that 2 punctate wounds in the central area, I question whether this actually was a bug bite or a spider bite. However given the tenderness increased redness, I will treat her  with antibiotics. I advised to return here if she has any worsening symptoms. Her tetanus shot was updated.    Rolan BuccoMelanie Leshaun Biebel, MD 03/23/14 2322

## 2014-04-22 IMAGING — US US OB TRANSVAGINAL
2 series · 12 of 28 positions shown · non-contrast
Comparison: none

[Series 1: us ob comp +14 wk · 4 of 26 slices shown (1 of 2)]
[im 4/26]
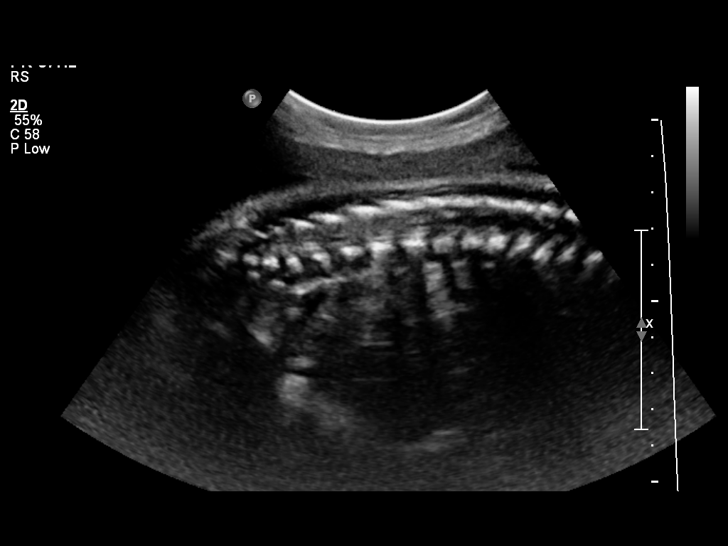
[im 10/26]
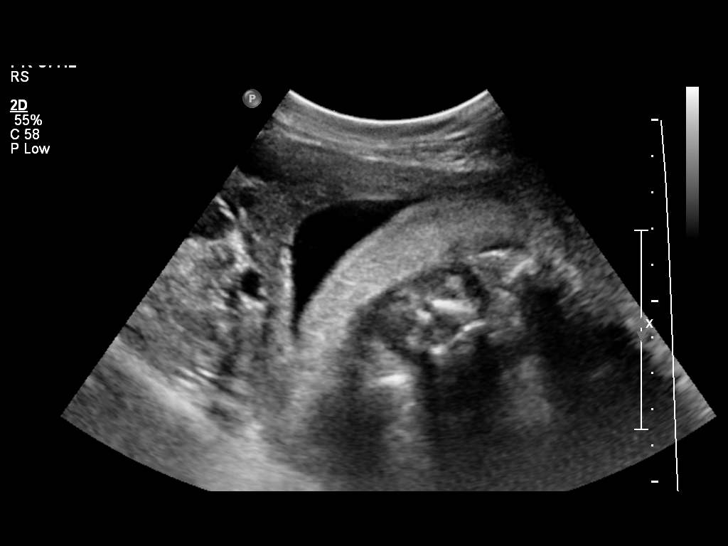
[im 16/26]
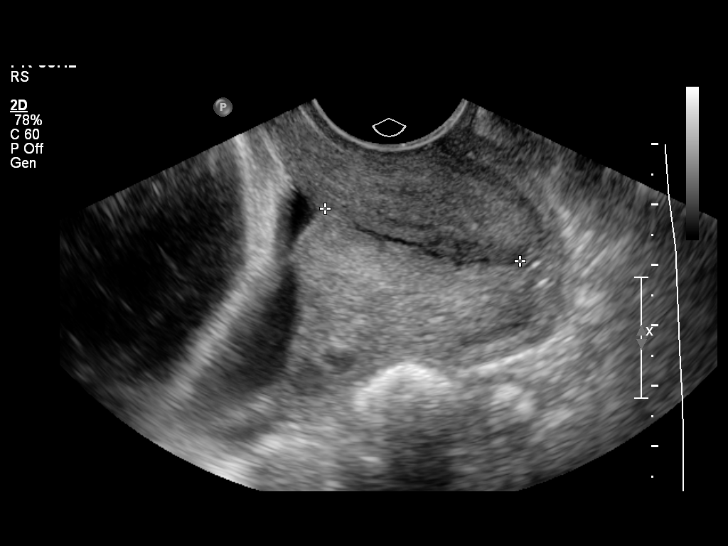
[im 26/26]
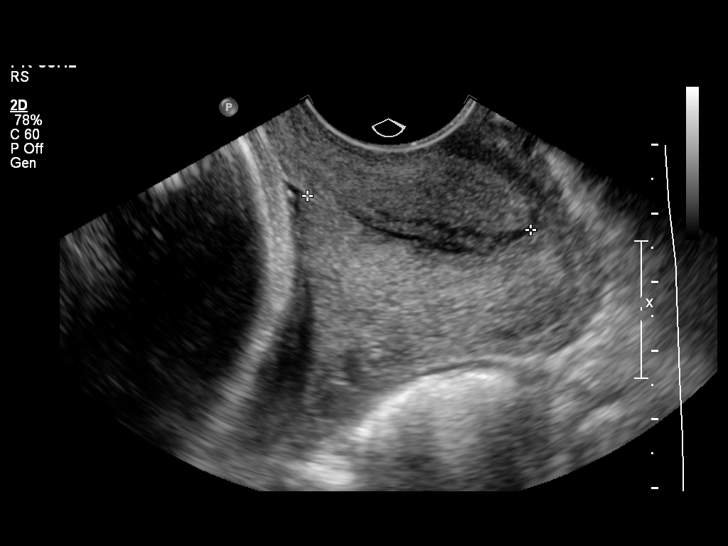

[Series 1: us ob comp +14 wk · 8 of 57 slices shown (2 of 2)]
[im 4/57]
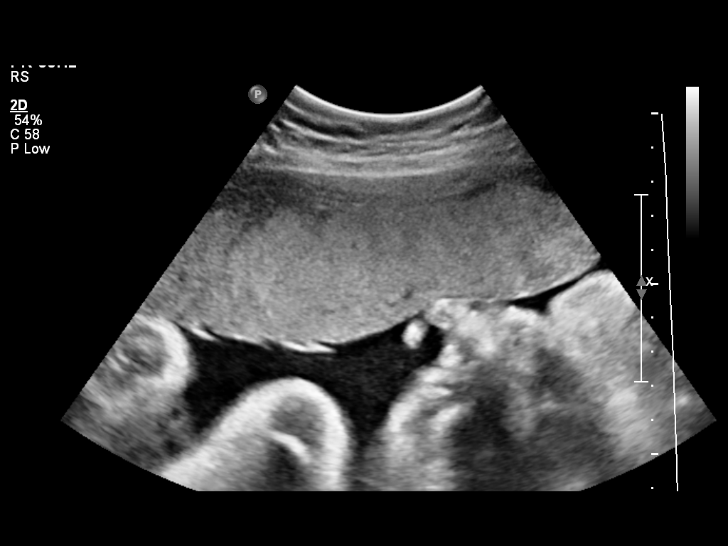
[im 10/57]
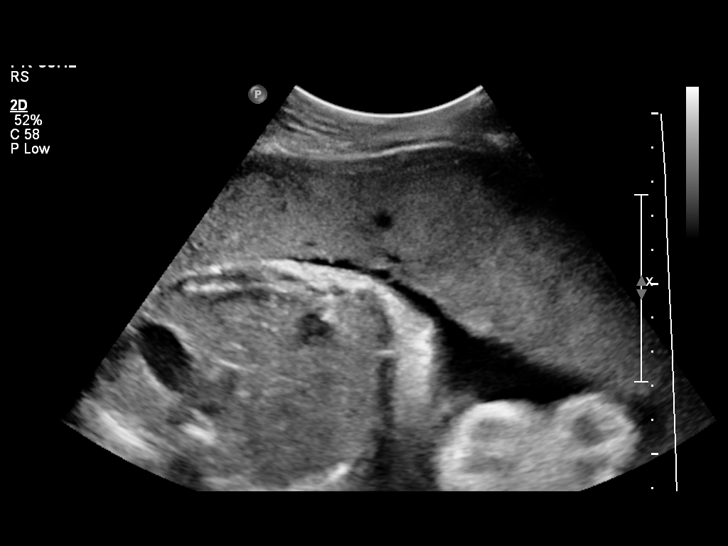
[im 19/57]
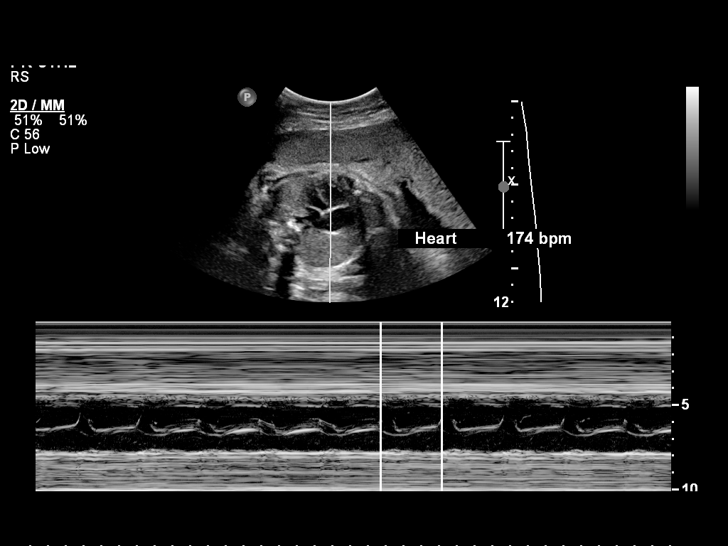
[im 25/57]
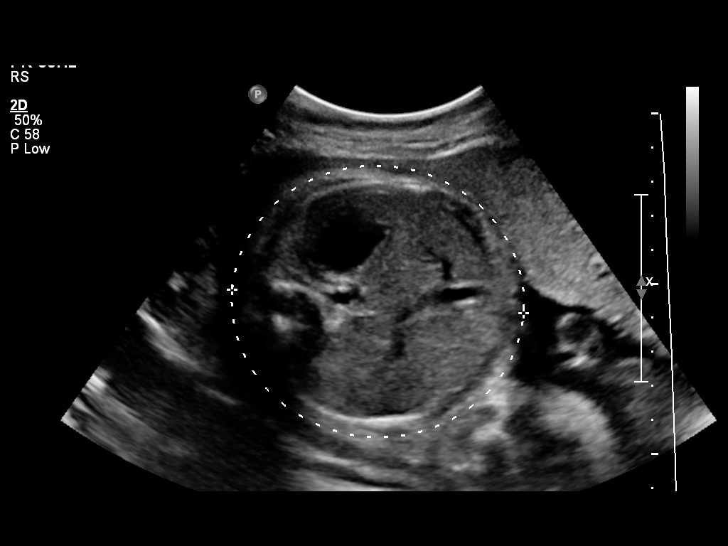
[im 32/57]
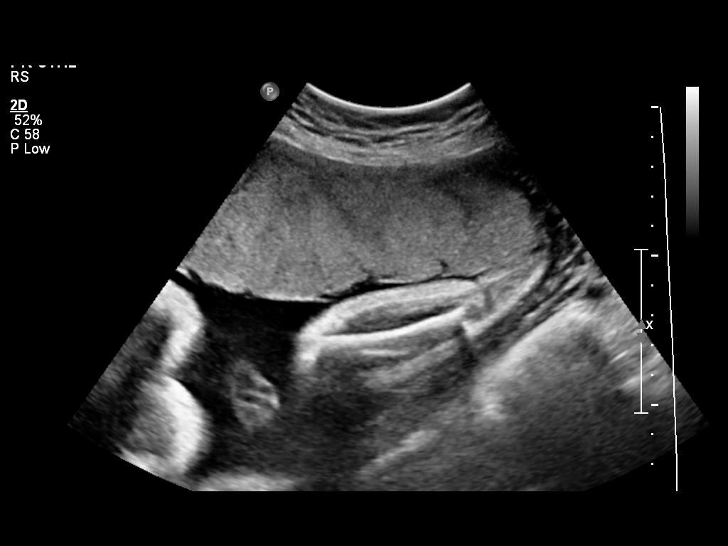
[im 41/57]
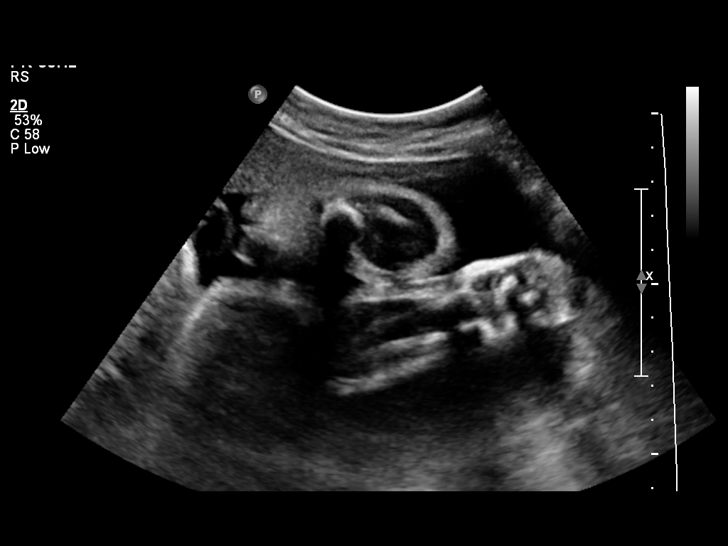
[im 47/57]
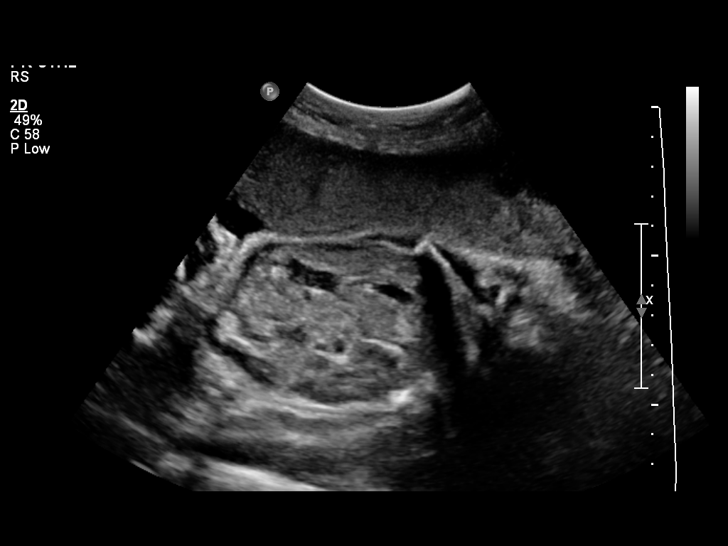
[im 53/57]
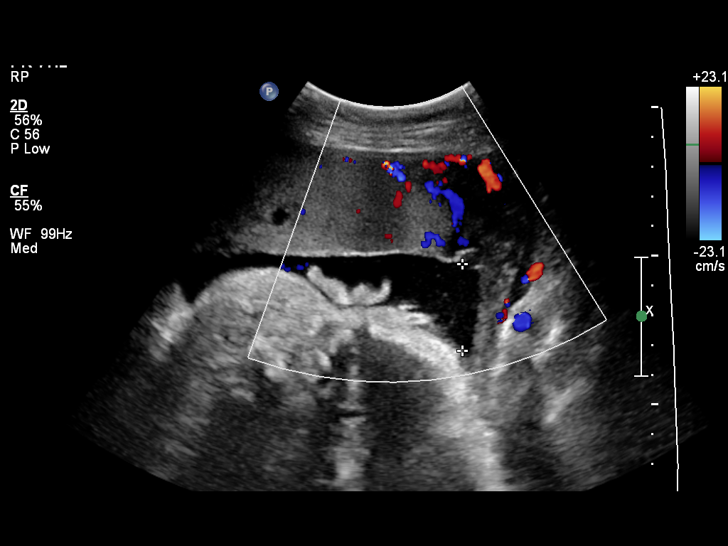

[12 of 28 positions shown; findings below may reference images not displayed]

OBSTETRICS REPORT
                      (Signed Final 12/17/2012 [DATE])

Service(s) Provided

 US OB COMP + 14 WK                                    76805.1
 US OB TRANSVAGINAL                                    76817.0
Indications

 Pelvic / Abdominal pain
 Poor obstetric history: Previous preterm delivery
Fetal Evaluation

 Num Of Fetuses:    1
 Fetal Heart Rate:  174                         bpm
 Cardiac Activity:  Observed
 Presentation:      Cephalic
 Placenta:          Anterior, above cervical os
 P. Cord            Visualized
 Insertion:

 Amniotic Fluid
 AFI FV:      Subjectively within normal limits
 AFI Sum:     17.84   cm      66   %Tile     Larg Pckt:   5.32   cm
 RUQ:   5.32   cm    RLQ:    4.36   cm    LUQ:   5.26    cm   LLQ:    2.9    cm
Biometry

 BPD:     80.8  mm    G. Age:   32w 3d                CI:        74.04   70 - 86
                                                      FL/HC:      20.5   19.1 -

 HC:     298.2  mm    G. Age:   33w 0d       44  %    HC/AC:      1.10   0.96 -

 AC:     270.3  mm    G. Age:   31w 1d       27  %    FL/BPD:     75.5   71 - 87
 FL:        61  mm    G. Age:   31w 5d       32  %    FL/AC:      22.6   20 - 24
 HUM:     54.8  mm    G. Age:   31w 6d       51  %

 Est. FW:    7932  gm    3 lb 15 oz      51  %
Gestational Age

 LMP:           31w 6d       Date:   05/07/12                 EDD:   02/11/13
 U/S Today:     32w 0d                                        EDD:   02/10/13
 Best:          31w 6d    Det. By:   LMP  (05/07/12)          EDD:   02/11/13
Anatomy
 Cranium:          Appears normal         Aortic Arch:      Basic anatomy
                                                            exam per order
 Fetal Cavum:      Appears normal         Ductal Arch:      Basic anatomy
                                                            exam per order
 Ventricles:       Appears normal         Diaphragm:        Appears normal
 Choroid Plexus:   Not well visualized    Stomach:          Appears normal, left
                                                            sided
 Cerebellum:       Appears normal         Abdomen:          Appears normal
 Posterior Fossa:  Appears normal         Abdominal Wall:   Appears nml (cord
                                                            insert, abd wall)
 Nuchal Fold:      Not applicable (>20    Cord Vessels:     Appears normal (3
                   wks GA)                                  vessel cord)
 Face:             Profile appears        Kidneys:          Appear normal
                   normal
 Lips:             Appears normal         Bladder:          Appears normal
 Heart:            Appears normal         Spine:            Appears normal
                   (4CH, axis, and
                   situs)
 RVOT:             Appears normal         Lower             Visualized
                                          Extremities:
 LVOT:             Appears normal         Upper             Visualized
                                          Extremities:

 Other:  Technically difficult due to advanced GA.
Cervix Uterus Adnexa

 Cervical Length:   3.3       cm

 Cervix:       Measured transvaginally. [place] measured 2.4 cm with Bottoms
               [place]
 Uterus:       No abnormality visualized.
 Left Ovary:   No adnexal mass visualized. Within normal limits.
 Right Ovary:  No adnexal mass visualized. Within normal limits.
 Adnexa:     No abnormality visualized.
Impression

 Single living IUP with assigned GA of 31w 6d. EGA by today's
 ultrasound is concordant with assigned GA.
 No fetal anatomic abnormality is identified. Examination
 technically difficult due to advanced GA.
 Normal amniotic fluid volume.
 Cervical length is 3.3 cm, and decreases to 2.4 cm with
 Valsalva.

 questions or concerns.

## 2014-08-22 ENCOUNTER — Encounter (HOSPITAL_BASED_OUTPATIENT_CLINIC_OR_DEPARTMENT_OTHER): Payer: Self-pay | Admitting: Emergency Medicine

## 2015-09-09 ENCOUNTER — Inpatient Hospital Stay (HOSPITAL_COMMUNITY)
Admission: AD | Admit: 2015-09-09 | Discharge: 2015-09-09 | Disposition: A | Discharge status: Home / Self Care | Payer: BLUE CROSS/BLUE SHIELD | Source: Ambulatory Visit | Attending: Obstetrics and Gynecology | Admitting: Obstetrics and Gynecology

## 2015-09-09 ENCOUNTER — Encounter (HOSPITAL_COMMUNITY): Payer: Self-pay | Admitting: *Deleted

## 2015-09-09 DIAGNOSIS — O4691 Antepartum hemorrhage, unspecified, first trimester: Secondary | ICD-10-CM | POA: Diagnosis not present

## 2015-09-09 DIAGNOSIS — Z3A01 Less than 8 weeks gestation of pregnancy: Secondary | ICD-10-CM | POA: Diagnosis not present

## 2015-09-09 DIAGNOSIS — O26891 Other specified pregnancy related conditions, first trimester: Secondary | ICD-10-CM | POA: Insufficient documentation

## 2015-09-09 DIAGNOSIS — K5900 Constipation, unspecified: Secondary | ICD-10-CM | POA: Insufficient documentation

## 2015-09-09 DIAGNOSIS — O039 Complete or unspecified spontaneous abortion without complication: Secondary | ICD-10-CM

## 2015-09-09 LAB — URINE MICROSCOPIC-ADD ON

## 2015-09-09 LAB — URINALYSIS, ROUTINE W REFLEX MICROSCOPIC
Bilirubin Urine: NEGATIVE
GLUCOSE, UA: NEGATIVE mg/dL
Ketones, ur: NEGATIVE mg/dL
LEUKOCYTES UA: NEGATIVE
Nitrite: NEGATIVE
PROTEIN: NEGATIVE mg/dL
SPECIFIC GRAVITY, URINE: 1.025 (ref 1.005–1.030)
pH: 6 (ref 5.0–8.0)

## 2015-09-09 LAB — HCG, QUANTITATIVE, PREGNANCY: hCG, Beta Chain, Quant, S: 4 m[IU]/mL (ref <5)

## 2015-09-09 NOTE — MAU Note (Signed)
Started bleeding lightly about 1730. About 2015 went to [facility] and wiped and was wet and pink on tissue. No pain. No BM in couple days so feel gas bubbles

## 2015-09-09 NOTE — MAU Provider Note (Signed)
History    Jamie Wright is a 29y.o. [id number] at 6 wks by LMP who presents, unannounced, for vaginal bleeding.  Patient states she has had 3 positive home UPTs this week.  Patient states she started spotting, this evening, around 1700 and it has gradually increased to bleeding.  Patient denies cramping, n/v, or pain.  Patient reports constipation with last bowel movement two days ago, but no issues with urination. Patient further denies recent sexual encounters.  Patient Active Problem List   Diagnosis Date Noted  . NSVD (normal spontaneous vaginal delivery) 01/29/2013  . 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 Preterm delivery 08/19/2012  . Preterm labor second trimester with preterm delivery third trimester 07/29/2012  . Pregnant state, incidental 07/27/2012  . Prior pregnancy with placenta abruption, antepartum 07/27/2012  . History of ectopic pregnancy 07/27/2012    Chief Complaint  Patient presents with  . Vaginal Bleeding   HPI  OB History    Gravida Para Term Preterm AB TAB SAB Ectopic Multiple Living   0 0 1 0 3      Past Medical History  Diagnosis Date  . UTI (urinary tract infection)   . Ectopic pregnancy   . Spinal headache     ? bad migraines following delivery  . Irregular heartbeat 2009    Hx of Echo done  . Preterm labor 2008    Late 2nd or Early 3rd trimester PTL, injection given to stop contractions, was d/c home w/ meds  . Preterm labor 2009    Preterm birth @ 32 wks; Abrupted placenta  . Preterm labor 2009    Prior to delivery of 2nd child;had contractions frequently  . Infection     BV;not frequent  . Infection     UTI;not frequent    Past Surgical History  Procedure Laterality Date  . No past surgeries      Family History  Problem Relation Age of Onset  . Hearing loss Neg Hx   . Other Mother     Charcot Byrd Hesselbach tooth  . Other Maternal Grandfather     Charcot Maria tooth  . Cancer Maternal Aunt     Breast;before menopause  .  Cerebral palsy Sister     Twins;1/2 sisters    Social History  Substance Use Topics  . Smoking status: Never Smoker   . Smokeless tobacco: Never Used  . Alcohol Use: No    Allergies: No Known Allergies  Prescriptions prior to admission  Medication Sig Dispense Refill Last Dose  . Prenatal Vit-Fe Fumarate-FA (PRENATAL MULTIVITAMIN) TABS tablet Take 1 tablet by mouth daily.    09/09/2015 at Unknown time    ROS  See HPI Above Physical Exam   Blood pressure 122/71, pulse 68, temperature 97.9 F (36.6 C), resp. rate 18, height  (1.6 m), weight 61.326 kg (135 lb 3.2 oz), last menstrual period 08/06/2015, unknown if currently breastfeeding.  Results for orders placed or performed during the hospital encounter of 09/09/15 (from the past 24 hour(s))  Urinalysis, Routine w reflex microscopic (not at [facility])     Status: Abnormal   Collection Time: 09/09/15  9:12 PM  Result Value Ref Range   Color, Urine YELLOW YELLOW   APPearance CLEAR CLEAR   Specific Gravity, Urine 1.025 1.005 - 1.030   pH 6.0 5.0 - 8.0   Glucose, UA NEGATIVE NEGATIVE mg/dL   Hgb urine dipstick LARGE (A) NEGATIVE   Bilirubin Urine NEGATIVE NEGATIVE  Ketones, ur NEGATIVE NEGATIVE mg/dL   Protein, ur NEGATIVE NEGATIVE mg/dL   Nitrite NEGATIVE NEGATIVE   Leukocytes, UA NEGATIVE NEGATIVE  Urine microscopic-add on     Status: Abnormal   Collection Time: 09/09/15  9:12 PM  Result Value Ref Range   Squamous Epithelial / LPF 6-30 (A) NONE SEEN   WBC, UA 6-30 0 - 5 WBC/hpf   RBC / HPF 0-5 0 - 5 RBC/hpf   Bacteria, UA FEW (A) NONE SEEN  hCG, quantitative, pregnancy     Status: None   Collection Time: 09/09/15  9:45 PM  Result Value Ref Range   hCG, Beta Chain, Quant, S 4 <5 mIU/mL    Physical Exam  Genitourinary: Cervix exhibits discharge. There is bleeding in the vagina.  Sterile Speculum Exam: -Vaginal Vault: Small amt of dark red blood noted in vault, scant clots noted when removed with swab -Cervix:  Appears closed, active bleeding from os Bimanual Exam: Closed      ED Course  Assessment: Negative UPT  Positive Home UPT x 3 Vaginal Bleeding  Plan: -PE as above -Labs Pending: Beta Hcg -UA normal -Will await beta and decide on need for further testing or US  Follow Up (2306) -Discussed spontaneous abortion; condolences given -Encouraged to wait one cycle before attempting to conceive again -No questions or concerns -Bleeding instructions given -Who and when to call for concerns and/or questions -Discharged to home in stable condition  Cherre RobinsJessica L Sharanya Templin CNM, MSN 09/09/2015 10:17 PM

## 2015-09-09 NOTE — Progress Notes (Signed)
Gerrit HeckJessica Emly CNM notified of pt's admission and status. Aware of pt's stating has gotten 3 pos upts at home but upt here negative. Orders received for upt and u/a

## 2015-09-09 NOTE — Discharge Instructions (Signed)
Miscarriage  A miscarriage is the sudden loss of an unborn baby (fetus) before the 20th week of pregnancy. Most miscarriages happen in the first 3 months of pregnancy. Sometimes, it happens before a woman even knows she is pregnant. A miscarriage is also called a "spontaneous miscarriage" or "early pregnancy loss." Having a miscarriage can be an emotional experience. Talk with your caregiver about any questions you may have about miscarrying, the grieving process, and your future pregnancy plans.  CAUSES    Problems with the fetal chromosomes that make it impossible for the baby to develop normally. Problems with the baby's genes or chromosomes are most often the result of errors that occur, by chance, as the embryo divides and grows. The problems are not inherited from the parents.   Infection of the cervix or uterus.    Hormone problems.    Problems with the cervix, such as having an incompetent cervix. This is when the tissue in the cervix is not strong enough to hold the pregnancy.    Problems with the uterus, such as an abnormally shaped uterus, uterine fibroids, or congenital abnormalities.    Certain medical conditions.    Smoking, drinking alcohol, or taking illegal drugs.    Trauma.   Often, the cause of a miscarriage is unknown.   SYMPTOMS    Vaginal bleeding or spotting, with or without cramps or pain.   Pain or cramping in the abdomen or lower back.   Passing fluid, tissue, or blood clots from the vagina.  DIAGNOSIS   Your caregiver will perform a physical exam. You may also have an ultrasound to confirm the miscarriage. Blood or urine tests may also be ordered.  TREATMENT    Sometimes, treatment is not necessary if you naturally pass all the fetal tissue that was in the uterus. If some of the fetus or placenta remains in the body (incomplete miscarriage), tissue left behind may become infected and must be removed. Usually, a dilation and curettage (D and C) procedure is performed.  During a D and C procedure, the cervix is widened (dilated) and any remaining fetal or placental tissue is gently removed from the uterus.   Antibiotic medicines are prescribed if there is an infection. Other medicines may be given to reduce the size of the uterus (contract) if there is a lot of bleeding.   If you have Rh negative blood and your baby was Rh positive, you will need a Rh immunoglobulin shot. This shot will protect any future baby from having Rh blood problems in future pregnancies.  HOME CARE INSTRUCTIONS    Your caregiver may order bed rest or may allow you to continue light activity. Resume activity as directed by your caregiver.   Have someone help with home and family responsibilities during this time.    Keep track of the number of sanitary pads you use each day and how soaked (saturated) they are. Write down this information.    Do not use tampons. Do not douche or have sexual intercourse until approved by your caregiver.    Only take over-the-counter or prescription medicines for pain or discomfort as directed by your caregiver.    Do not take aspirin. Aspirin can cause bleeding.    Keep all follow-up appointments with your caregiver.    If you or your partner have problems with grieving, talk to your caregiver or seek counseling to help cope with the pregnancy loss. Allow enough time to grieve before trying to get pregnant again.     SEEK IMMEDIATE MEDICAL CARE IF:    You have severe cramps or pain in your back or abdomen.   You have a fever.   You pass large blood clots (walnut-sized or larger) ortissue from your vagina. Save any tissue for your caregiver to inspect.    Your bleeding increases.    You have a thick, bad-smelling vaginal discharge.   You become lightheaded, weak, or you faint.    You have chills.   MAKE SURE YOU:   Understand these instructions.   Will watch your condition.   Will get help right away if you are not doing well or get worse.     This  information is not intended to replace advice given to you by your health care provider. Make sure you discuss any questions you have with your health care provider.     Document Released: 04/02/2001 Document Revised: 02/01/2013 Document Reviewed: 11/26/2011  Elsevier Interactive Patient Education 2016 [employer]

## 2015-09-09 NOTE — Progress Notes (Signed)
Jessica Emly CNM in earlier to discuss test results and d/c plan. Written and verbal d/c instructions given and understanding voiced. 

## 2016-06-19 ENCOUNTER — Inpatient Hospital Stay (HOSPITAL_COMMUNITY): Payer: BLUE CROSS/BLUE SHIELD

## 2016-06-19 ENCOUNTER — Encounter (HOSPITAL_COMMUNITY): Payer: Self-pay | Admitting: *Deleted

## 2016-06-19 ENCOUNTER — Inpatient Hospital Stay (HOSPITAL_COMMUNITY)
Admission: AD | Admit: 2016-06-19 | Discharge: 2016-06-19 | Disposition: A | Discharge status: Home / Self Care | Payer: BLUE CROSS/BLUE SHIELD | Source: Ambulatory Visit | Attending: Obstetrics & Gynecology | Admitting: Obstetrics & Gynecology

## 2016-06-19 DIAGNOSIS — Z79899 Other long term (current) drug therapy: Secondary | ICD-10-CM | POA: Insufficient documentation

## 2016-06-19 DIAGNOSIS — O26899 Other specified pregnancy related conditions, unspecified trimester: Secondary | ICD-10-CM

## 2016-06-19 DIAGNOSIS — O43891 Other placental disorders, first trimester: Secondary | ICD-10-CM

## 2016-06-19 DIAGNOSIS — O26891 Other specified pregnancy related conditions, first trimester: Secondary | ICD-10-CM | POA: Insufficient documentation

## 2016-06-19 DIAGNOSIS — O468X1 Other antepartum hemorrhage, first trimester: Secondary | ICD-10-CM

## 2016-06-19 DIAGNOSIS — R109 Unspecified abdominal pain: Secondary | ICD-10-CM | POA: Insufficient documentation

## 2016-06-19 DIAGNOSIS — R110 Nausea: Secondary | ICD-10-CM

## 2016-06-19 DIAGNOSIS — O208 Other hemorrhage in early pregnancy: Secondary | ICD-10-CM | POA: Insufficient documentation

## 2016-06-19 DIAGNOSIS — O3680X0 Pregnancy with inconclusive fetal viability, not applicable or unspecified: Secondary | ICD-10-CM

## 2016-06-19 DIAGNOSIS — Z3A08 8 weeks gestation of pregnancy: Secondary | ICD-10-CM | POA: Insufficient documentation

## 2016-06-19 DIAGNOSIS — O9989 Other specified diseases and conditions complicating pregnancy, childbirth and the puerperium: Secondary | ICD-10-CM

## 2016-06-19 DIAGNOSIS — O418X10 Other specified disorders of amniotic fluid and membranes, first trimester, not applicable or unspecified: Secondary | ICD-10-CM

## 2016-06-19 HISTORY — DX: Complete or unspecified spontaneous abortion without complication: O03.9

## 2016-06-19 LAB — URINALYSIS, ROUTINE W REFLEX MICROSCOPIC
Bilirubin Urine: NEGATIVE
Glucose, UA: NEGATIVE mg/dL
Hgb urine dipstick: NEGATIVE
KETONES UR: NEGATIVE mg/dL
LEUKOCYTES UA: NEGATIVE
NITRITE: NEGATIVE
PROTEIN: NEGATIVE mg/dL
Specific Gravity, Urine: 1.025 (ref 1.005–1.030)
pH: 6 (ref 5.0–8.0)

## 2016-06-19 LAB — CBC
HCT: 40.1 % (ref 36.0–46.0)
HEMOGLOBIN: 14.1 g/dL (ref 12.0–15.0)
MCH: 31.9 pg (ref 26.0–34.0)
MCHC: 35.2 g/dL (ref 30.0–36.0)
MCV: 90.7 fL (ref 78.0–100.0)
PLATELETS: 263 10*3/uL (ref 150–400)
RBC: 4.42 MIL/uL (ref 3.87–5.11)
RDW: 12.8 % (ref 11.5–15.5)
WBC: 5.5 10*3/uL (ref 4.0–10.5)

## 2016-06-19 LAB — WET PREP, GENITAL
CLUE CELLS WET PREP: NONE SEEN
SPERM: NONE SEEN
TRICH WET PREP: NONE SEEN
WBC WET PREP: NONE SEEN
YEAST WET PREP: NONE SEEN

## 2016-06-19 LAB — HCG, QUANTITATIVE, PREGNANCY: HCG, BETA CHAIN, QUANT, S: 1028 m[IU]/mL — AB (ref <5)

## 2016-06-19 LAB — POCT PREGNANCY, URINE: PREG TEST UR: POSITIVE — AB

## 2016-06-19 MED ORDER — PROMETHAZINE HCL 12.5 MG PO TABS: 12.5000 mg | ORAL_TABLET | Freq: Four times a day (QID) | ORAL | 0 refills | Status: AC | PRN

## 2016-06-19 NOTE — Discharge Instructions (Signed)
Abdominal Pain During Pregnancy °Abdominal pain is common in pregnancy. Most of the time, it does not cause harm. There are many causes of abdominal pain. Some causes are more serious than others. Some of the causes of abdominal pain in pregnancy are easily diagnosed. Occasionally, the diagnosis takes time to understand. Other times, the cause is not determined. Abdominal pain can be a sign that something is very wrong with the pregnancy, or the pain may have nothing to do with the pregnancy at all. For this reason, always tell your health care provider if you have any abdominal discomfort. °HOME CARE INSTRUCTIONS  °Monitor your abdominal pain for any changes. The following actions may help to alleviate any discomfort you are experiencing: °· Do not have sexual intercourse or put anything in your vagina until your symptoms go away completely. °· Get plenty of rest until your pain improves. °· Drink clear fluids if you feel nauseous. Avoid solid food as long as you are uncomfortable or nauseous. °· Only take over-the-counter or prescription medicine as directed by your health care provider. °· Keep all follow-up appointments with your health care provider. °SEEK IMMEDIATE MEDICAL CARE IF: °· You are bleeding, leaking fluid, or passing tissue from the vagina. °· You have increasing pain or cramping. °· You have persistent vomiting. °· You have painful or bloody urination. °· You have a fever. °· You notice a decrease in your baby's movements. °· You have extreme weakness or feel faint. °· You have shortness of breath, with or without abdominal pain. °· You develop a severe headache with abdominal pain. °· You have abnormal vaginal discharge with abdominal pain. °· You have persistent diarrhea. °· You have abdominal pain that continues even after rest, or gets worse. °MAKE SURE YOU:  °· Understand these instructions. °· Will watch your condition. °· Will get help right away if you are not doing well or get worse. °    °This information is not intended to replace advice given to you by your health care provider. Make sure you discuss any questions you have with your health care provider. °  °Document Released: 10/07/2005 Document Revised: 07/28/2013 Document Reviewed: 05/06/2013 °Elsevier Interactive Patient Education ©2016 [employer] °Subchorionic Hematoma °A subchorionic hematoma is a gathering of blood between the outer wall of the placenta and the inner wall of the womb (uterus). The placenta is the organ that connects the fetus to the wall of the uterus. The placenta performs the feeding, breathing (oxygen to the fetus), and waste removal (excretory work) of the fetus.  °Subchorionic hematoma is the most common abnormality found on a result from ultrasonography done during the first trimester or early second trimester of pregnancy. If there has been little or no vaginal bleeding, early small hematomas usually shrink on their own and do not affect your baby or pregnancy. The blood is gradually absorbed over 1-2 weeks. When bleeding starts later in pregnancy or the hematoma is larger or occurs in an older pregnant woman, the outcome may not be as good. Larger hematomas may get bigger, which increases the chances for miscarriage. Subchorionic hematoma also increases the risk of premature detachment of the placenta from the uterus, preterm (premature) labor, and stillbirth. °HOME CARE INSTRUCTIONS °· Stay on bed rest if your health care provider recommends this. Although bed rest will not prevent more bleeding or prevent a miscarriage, your health care provider may recommend bed rest until you are advised otherwise. °· Avoid heavy lifting (more than 10 lb [4.5 kg]),   exercise, sexual intercourse, or douching as directed by your health care provider. °· Keep track of the number of pads you use each day and how soaked (saturated) they are. Write down this information. °· Do not use tampons. °· Keep all follow-up appointments as  directed by your health care provider. Your health care provider may ask you to have follow-up blood tests or ultrasound tests or both. °SEEK IMMEDIATE MEDICAL CARE IF: °· You have severe cramps in your stomach, back, abdomen, or pelvis. °· You have a fever. °· You pass large clots or tissue. Save any tissue for your health care provider to look at. °· Your bleeding increases or you become lightheaded, feel weak, or have fainting episodes. °  °This information is not intended to replace advice given to you by your health care provider. Make sure you discuss any questions you have with your health care provider. °  °Document Released: 01/22/2007 Document Revised: 10/28/2014 Document Reviewed: 05/06/2013 °Elsevier Interactive Patient Education ©2016 [employer] ° ° °

## 2016-06-19 NOTE — MAU Provider Note (Signed)
History     CSN: [id number]  Arrival date and time: 06/19/16 1013   First Provider Initiated Contact with Patient 06/19/16 1054      Chief Complaint  Patient presents with  . Abdominal Cramping   HPI  AZARIA STEGMAN is a 30 y.o. [id number] at [redacted]w[redacted]d by LMP who presents with a abdominal pain. Symptoms began at 2 am this morning. Woke up with constant lower abdominal cramping that lasted for 45 minutes; pain was 7/10. Associated with pink spotting that has since resolved. Did not treat pain; no aggravating or alleviating factors. Since then pain is now dull cramping that she rates 3/10.  Some nausea; no vomiting. Denies diarrhea, constipation, dysuria, or vaginal discharge. No recent intercourse. Has not started prenatal care. Hx of ectopic treated with MTX & a miscarriage last November.   OB History    Gravida Para Term Preterm AB Living   6 3 2 1 2 3    SAB TAB Ectopic Multiple Live Births   1 0 1 0 3      Past Medical History:  Diagnosis Date  . Ectopic pregnancy   . Irregular heartbeat 2009   Hx of Echo done  . Miscarriage   . Preterm labor 2008   Late 2nd or Early 3rd trimester PTL, injection given to stop contractions, was d/c home w/ meds  . Preterm labor 2009   Preterm birth @ 32 wks; Abrupted placenta  . Spinal headache    ? bad migraines following delivery    Past Surgical History:  Procedure Laterality Date  . NO PAST SURGERIES      Family History  Problem Relation Age of Onset  . Other Mother     Charcot Byrd Hesselbach tooth  . Other Maternal Grandfather     Charcot Maria tooth  . Cancer Maternal Aunt     Breast;before menopause  . Cerebral palsy Sister     Twins;1/2 sisters  . Hearing loss Neg Hx     Social History  Substance Use Topics  . Smoking status: Never Smoker  . Smokeless tobacco: Never Used  . Alcohol use No    Allergies: No Known Allergies  Prescriptions Prior to Admission  Medication Sig Dispense Refill Last Dose  . Prenatal Vit-Fe  Fumarate-FA (PRENATAL MULTIVITAMIN) TABS tablet Take 1 tablet by mouth daily.    06/19/2016 at Unknown time    Review of Systems  Constitutional: Negative for chills and fever.  Gastrointestinal: Positive for abdominal pain and nausea. Negative for constipation, diarrhea and vomiting.  Genitourinary: Negative.    Physical Exam   Blood pressure 104/60, pulse 89, temperature 98 F (36.7 C), temperature source Oral, resp. rate 18, height 5\' 3"  (1.6 m), weight 125 lb 12 oz (57 kg), last menstrual period 04/20/2016, unknown if currently breastfeeding.  Physical Exam  Nursing note and vitals reviewed. Constitutional: She is oriented to person, place, and time. She appears well-developed and well-nourished. No distress.  HENT:  Head: Normocephalic and atraumatic.  Eyes: Conjunctivae are normal. Right eye exhibits no discharge. Left eye exhibits no discharge. No scleral icterus.  Neck: Normal range of motion.  Cardiovascular: Normal rate and normal heart sounds.   Respiratory: Effort normal. No respiratory distress.  GI: Soft. She exhibits no distension. There is no tenderness. There is no rebound.  Genitourinary: Cervix exhibits no motion tenderness and no friability. Right adnexum displays no mass and no tenderness. Left adnexum displays no mass and no tenderness. No bleeding in the vagina. Vaginal discharge (  small amount of thin white discharge) found.  Genitourinary Comments: Cervix closed  Neurological: She is alert and oriented to person, place, and time.  Skin: Skin is warm and dry. She is not diaphoretic.  Psychiatric: She has a normal mood and affect. Her behavior is normal. Judgment and thought content normal.    MAU Course  Procedures Results for orders placed or performed during the hospital encounter of 06/19/16 (from the past 24 hour(s))  Urinalysis, Routine w reflex microscopic (not at [facility])     Status: Abnormal   Collection Time: 06/19/16 10:30 AM  Result Value Ref Range    Color, Urine YELLOW YELLOW   APPearance HAZY (A) CLEAR   Specific Gravity, Urine 1.025 1.005 - 1.030   pH 6.0 5.0 - 8.0   Glucose, UA NEGATIVE NEGATIVE mg/dL   Hgb urine dipstick NEGATIVE NEGATIVE   Bilirubin Urine NEGATIVE NEGATIVE   Ketones, ur NEGATIVE NEGATIVE mg/dL   Protein, ur NEGATIVE NEGATIVE mg/dL   Nitrite NEGATIVE NEGATIVE   Leukocytes, UA NEGATIVE NEGATIVE  Pregnancy, urine POC     Status: Abnormal   Collection Time: 06/19/16 10:37 AM  Result Value Ref Range   Preg Test, Ur POSITIVE (A) NEGATIVE  CBC     Status: None   Collection Time: 06/19/16 10:51 AM  Result Value Ref Range   WBC 5.5 4.0 - 10.5 K/uL   RBC 4.42 3.87 - 5.11 MIL/uL   Hemoglobin 14.1 12.0 - 15.0 g/dL   HCT 16.1 09.6 - 04.5 %   MCV 90.7 78.0 - 100.0 fL   MCH 31.9 26.0 - 34.0 pg   MCHC 35.2 30.0 - 36.0 g/dL   RDW 40.9 81.1 - 91.4 %   Platelets 263 150 - 400 K/uL  hCG, quantitative, pregnancy     Status: Abnormal   Collection Time: 06/19/16 10:51 AM  Result Value Ref Range   hCG, Beta Chain, Quant, S 1,028 (H) <5 mIU/mL  Wet prep, genital     Status: None   Collection Time: 06/19/16 11:05 AM  Result Value Ref Range   Yeast Wet Prep HPF POC NONE SEEN NONE SEEN   Trich, Wet Prep NONE SEEN NONE SEEN   Clue Cells Wet Prep HPF POC NONE SEEN NONE SEEN   WBC, Wet Prep HPF POC NONE SEEN NONE SEEN   Sperm NONE SEEN    US Ob Comp Less 14 Wks  Result Date: 06/19/2016 CLINICAL DATA:  Abdominal pain affecting pregnancy. First-trimester pregnancy. EXAM: OBSTETRIC <14 WK [place] AND TRANSVAGINAL OB US TECHNIQUE: Both transabdominal and transvaginal ultrasound examinations were performed for complete evaluation of the gestation as well as the maternal uterus, adnexal regions, and pelvic cul-de-sac. Transvaginal technique was performed to assess early pregnancy. COMPARISON:  None for this pregnancy. FINDINGS: Intrauterine gestational sac: Single Yolk sac:  Not visualized Embryo:  Not visualized Cardiac Activity: Not  visualized MSD: 3.2  mm   5 w   0  d Subchorionic hemorrhage:  Small.  A 0.9 cm maximal. Maternal uterus/adnexae: A well-defined a corpus luteal cyst is present in the right ovary. The left ovary is unremarkable. No significant free fluid is present. IMPRESSION: 1. Single intrauterine gestational sac with mean sac diameter corresponding with a gestational age of [redacted] weeks 0 days. No fetal pole is demonstrated. A fetal pole is not necessarily visualized this early in pregnancy, and the findings may be due to early gestation. A blighted ovum is not excluded. Follow-up beta HCG levels and consideration of follow-up ultrasound  in 7-10 days are recommended to assess fetal viability. Electronically Signed   By: Marin Robertshristopher  Mattern M.D.   On: 06/19/2016 12:34   [place] Ob Transvaginal  Result Date: 06/19/2016 CLINICAL DATA:  Abdominal pain affecting pregnancy. First-trimester pregnancy. EXAM: OBSTETRIC <14 WK US AND TRANSVAGINAL OB US TECHNIQUE: Both transabdominal and transvaginal ultrasound examinations were performed for complete evaluation of the gestation as well as the maternal uterus, adnexal regions, and pelvic cul-de-sac. Transvaginal technique was performed to assess early pregnancy. COMPARISON:  None for this pregnancy. FINDINGS: Intrauterine gestational sac: Single Yolk sac:  Not visualized Embryo:  Not visualized Cardiac Activity: Not visualized MSD: 3.2  mm   5 w   0  d Subchorionic hemorrhage:  Small.  A 0.9 cm maximal. Maternal uterus/adnexae: A well-defined a corpus luteal cyst is present in the right ovary. The left ovary is unremarkable. No significant free fluid is present. IMPRESSION: 1. Single intrauterine gestational sac with mean sac diameter corresponding with a gestational age of [redacted] weeks 0 days. No fetal pole is demonstrated. A fetal pole is not necessarily visualized this early in pregnancy, and the findings may be due to early gestation. A blighted ovum is not excluded. Follow-up beta HCG levels  and consideration of follow-up ultrasound in 7-10 days are recommended to assess fetal viability. Electronically Signed   By: Marin Robertshristopher  Mattern M.D.   On: 06/19/2016 12:34     MDM UPT positive O positive CBC, BHCG, GC/CT, wet prep, ultrasound Ultrasound shows possible IUGS, no yolk sac. Small SCH.   Findings today could represent a normal early pregnancy, spontaneous abortion or ectopic pregnancy which can be life-threatening.  Ectopic precautions were given to the patient with plan to return in 48 hours for repeat quant hcg to evaluate pregnancy development.  Assessment and Plan  A: 1. Pregnancy of unknown anatomic location   2. Abdominal pain affecting pregnancy   3. Subchorionic hematoma in first trimester   4. Pregnancy related nausea, antepartum    P: Discharge home Rx phenergan Discussed reasons to return to [facility]  Judeth Hornrin Capitola Ladson 06/19/2016, 10:53 AM

## 2016-06-19 NOTE — MAU Note (Signed)
About 0200 woke up cramping and spotting. +HPT 2 wks ago.  Cramping continues, has not seen anymore bleeding. Had miscarriage in Nov.

## 2016-06-19 NOTE — MAU Note (Signed)
Had spotting and severe cramping (rated pain @ 7) this AM around 0200; unsure of dates- pt had a weird period in Aug (August 1); denies any bleeding now and cramping pain has decreased to 3 now;

## 2016-06-20 LAB — GC/CHLAMYDIA PROBE AMP (~~LOC~~) NOT AT ARMC
CHLAMYDIA, DNA PROBE: NEGATIVE
NEISSERIA GONORRHEA: NEGATIVE

## 2016-06-20 LAB — HIV ANTIBODY (ROUTINE TESTING W REFLEX): HIV SCREEN 4TH GENERATION: NONREACTIVE

## 2016-06-21 ENCOUNTER — Other Ambulatory Visit: Payer: BLUE CROSS/BLUE SHIELD | Admitting: General Practice

## 2016-06-21 DIAGNOSIS — O3680X0 Pregnancy with inconclusive fetal viability, not applicable or unspecified: Secondary | ICD-10-CM

## 2016-06-21 LAB — HCG, QUANTITATIVE, PREGNANCY: HCG, BETA CHAIN, QUANT, S: 2414 m[IU]/mL — AB (ref <5)

## 2016-06-21 NOTE — Progress Notes (Addendum)
Patient here for stat bhcg today. Patient denies pain or bleeding. Patient states she cannot stay 2 hours for results and has to leave. Patient advised to wait and concern if an emergency were to happen she would need quick access to care & encouraged patient to stay. Patient states she has to leave & will call for results in a couple hours. Reviewed patient's labs/history with Dr Macon LargeAnyanwu, who agrees with favorable rise in bhcg- patient needs u/s follow up in 1.5 weeks to confirm viability with brief office appt for results following. Scheduled u/s 9/14 @ 8am. Called patient & informed her of results/plan of care. Informed patient of u/s appt with brief office appt to follow. Patient verbalized understanding to all & had no questions     Agree with nurses's documentation of this patient's clinic encounter.  Tereso NewcomerUgonna A Anyanwu, MD

## 2016-07-04 ENCOUNTER — Ambulatory Visit (HOSPITAL_COMMUNITY)
Admission: RE | Admit: 2016-07-04 | Discharge: 2016-07-04 | Disposition: A | Discharge status: Home / Self Care | Payer: Managed Care, Other (non HMO) | Source: Ambulatory Visit | Attending: Obstetrics & Gynecology | Admitting: Obstetrics & Gynecology

## 2016-07-04 ENCOUNTER — Ambulatory Visit: Payer: Managed Care, Other (non HMO)

## 2016-07-04 DIAGNOSIS — O3680X0 Pregnancy with inconclusive fetal viability, not applicable or unspecified: Secondary | ICD-10-CM

## 2016-07-04 DIAGNOSIS — N8311 Corpus luteum cyst of right ovary: Secondary | ICD-10-CM | POA: Diagnosis not present

## 2016-07-04 DIAGNOSIS — Z3A01 Less than 8 weeks gestation of pregnancy: Secondary | ICD-10-CM | POA: Insufficient documentation

## 2016-07-04 DIAGNOSIS — Z3491 Encounter for supervision of normal pregnancy, unspecified, first trimester: Secondary | ICD-10-CM

## 2016-07-04 DIAGNOSIS — Z36 Encounter for antenatal screening of mother: Secondary | ICD-10-CM | POA: Insufficient documentation

## 2016-07-04 DIAGNOSIS — O3481 Maternal care for other abnormalities of pelvic organs, first trimester: Secondary | ICD-10-CM | POA: Insufficient documentation

## 2016-07-04 NOTE — Progress Notes (Signed)
Patient presented today for her U/S results. Discuss with patient we will need to get another u/s in 10 days since she is still early pregnant to make sure baby is growing and developing.  Patient has been scheduled for U/S on 07/15/2016@8 :00. I have left patient a message concerning details of U/S appointment.

## 2016-07-15 ENCOUNTER — Ambulatory Visit (HOSPITAL_COMMUNITY): Admission: RE | Admit: 2016-07-15 | Payer: Managed Care, Other (non HMO) | Source: Ambulatory Visit

## 2017-04-24 ENCOUNTER — Encounter (HOSPITAL_COMMUNITY): Payer: Self-pay

## 2018-07-08 ENCOUNTER — Emergency Department (HOSPITAL_BASED_OUTPATIENT_CLINIC_OR_DEPARTMENT_OTHER)
Admission: EM | Admit: 2018-07-08 | Discharge: 2018-07-09 | Disposition: A | Discharge status: Home / Self Care | Payer: BLUE CROSS/BLUE SHIELD | Attending: Emergency Medicine | Admitting: Emergency Medicine

## 2018-07-08 ENCOUNTER — Emergency Department (HOSPITAL_BASED_OUTPATIENT_CLINIC_OR_DEPARTMENT_OTHER): Payer: BLUE CROSS/BLUE SHIELD

## 2018-07-08 ENCOUNTER — Encounter (HOSPITAL_BASED_OUTPATIENT_CLINIC_OR_DEPARTMENT_OTHER): Payer: Self-pay

## 2018-07-08 ENCOUNTER — Other Ambulatory Visit: Payer: Self-pay

## 2018-07-08 DIAGNOSIS — Z79899 Other long term (current) drug therapy: Secondary | ICD-10-CM | POA: Insufficient documentation

## 2018-07-08 DIAGNOSIS — N201 Calculus of ureter: Secondary | ICD-10-CM | POA: Diagnosis not present

## 2018-07-08 DIAGNOSIS — R1031 Right lower quadrant pain: Secondary | ICD-10-CM | POA: Diagnosis present

## 2018-07-08 LAB — URINALYSIS, ROUTINE W REFLEX MICROSCOPIC
BILIRUBIN URINE: NEGATIVE
Glucose, UA: NEGATIVE mg/dL
KETONES UR: NEGATIVE mg/dL
NITRITE: NEGATIVE
PROTEIN: 100 mg/dL — AB
Specific Gravity, Urine: 1.025 (ref 1.005–1.030)
pH: 6.5 (ref 5.0–8.0)

## 2018-07-08 LAB — WET PREP, GENITAL
Clue Cells Wet Prep HPF POC: NONE SEEN
SPERM: NONE SEEN
TRICH WET PREP: NONE SEEN
YEAST WET PREP: NONE SEEN

## 2018-07-08 LAB — URINALYSIS, MICROSCOPIC (REFLEX)

## 2018-07-08 LAB — PREGNANCY, URINE: Preg Test, Ur: NEGATIVE

## 2018-07-08 MED ORDER — HYDROCODONE-ACETAMINOPHEN 5-325 MG PO TABS: 1.0000 | ORAL_TABLET | ORAL | 0 refills | Status: AC | PRN

## 2018-07-08 MED ORDER — ACETAMINOPHEN 325 MG PO TABS
650.0000 mg | ORAL_TABLET | Freq: Once | ORAL | Status: AC
  Administered 2018-07-08: 650 mg via ORAL
  Filled 2018-07-08: qty 2

## 2018-07-08 NOTE — ED Notes (Signed)
Gave strainer to patient; instructed on use; patient verbalized understanding.

## 2018-07-08 NOTE — Discharge Instructions (Addendum)
If you develop fever, vomiting, worsening pain, urinary tract infection symptoms such as burning when urinating, or any other new/concerning symptoms and return to the ER for evaluation.  Otherwise if you are still having symptoms over the next few days, follow-up with urology.

## 2018-07-08 NOTE — ED Notes (Signed)
ED Provider at bedside. 

## 2018-07-08 NOTE — ED Provider Notes (Signed)
[facility] EMERGENCY DEPARTMENT Provider Note   CSN: [id number] Arrival date & time: 07/08/18  1849     History   Chief Complaint Chief Complaint  Patient presents with  . Abdominal Pain    HPI Jamie Wright is a 32 y.o. female.  HPI  32 year old female with a prior history of ectopic pregnancy presents with acute right pelvic pain and right-sided back pain.  Symptoms started this morning around 2:30 AM and awoke her from sleep.  Had significant pain in her right lower abdomen.  Feels like a sharp pain.  Pain also now is going into her back on the right side.  The pain in the back is more severe than the pain in the front now.  Is about a 6 out of 10.  She took 800 mg ibuprofen and laid in a hot bath but did not have any significant relief.  No fevers, vomiting, or urinary symptoms.  No vaginal bleeding or discharge.  Recently stopped breast-feeding a couple months ago and states she had an on and off menstrual cycle with spotting frequently but otherwise has not had a normal menstrual cycle since coming off of the breast-feeding.  Past Medical History:  Diagnosis Date  . Ectopic pregnancy   . Irregular heartbeat 2009   Hx of Echo done  . Miscarriage   . Preterm labor 2008   Late 2nd or Early 3rd trimester PTL, injection given to stop contractions, was d/c home w/ meds  . Preterm labor 2009   Preterm birth @ 32 wks; Abrupted placenta  . Spinal headache    ? bad migraines following delivery    Patient Active Problem List   Diagnosis Date Noted  . NSVD (normal spontaneous vaginal delivery) 01/29/2013  . 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 Preterm delivery 08/19/2012  . Preterm labor second trimester with preterm delivery third trimester 07/29/2012  . Pregnant state, incidental 07/27/2012  . Prior pregnancy with placenta abruption, antepartum 07/27/2012  . History of ectopic pregnancy 07/27/2012    Past Surgical History:  Procedure Laterality Date  . NO  PAST SURGERIES       OB History    Gravida  6   Para  3   Term  2   Preterm  1   AB  2   Living  3     SAB  1   TAB  0   Ectopic  1   Multiple  0   Live Births  3            Home Medications    Prior to Admission medications   Medication Sig Start Date End Date Taking? Authorizing Provider  HYDROcodone-acetaminophen (NORCO) 5-325 MG tablet Take 1 tablet by mouth every 4 (four) hours as needed for severe pain. 07/08/18   Pricilla Loveless, MD  Prenatal Vit-Fe Fumarate-FA (PRENATAL MULTIVITAMIN) TABS tablet Take 1 tablet by mouth daily.     [provider]  promethazine (PHENERGAN) 12.5 MG tablet Take 1 tablet (12.5 mg total) by mouth every 6 (six) hours as needed for nausea or vomiting. Patient not taking: Reported on 07/04/2016 06/19/16   Judeth Horn, NP    Family History Family History  Problem Relation Age of Onset  . Other Mother        Charcot Byrd Hesselbach tooth  . Other Maternal Grandfather        Charcot Maria tooth  . Cancer Maternal Aunt        Breast;before menopause  .  Cerebral palsy Sister        Twins;1/2 sisters  . Hearing loss Neg Hx     Social History Social History   Tobacco Use  . Smoking status: Never Smoker  . Smokeless tobacco: Never Used  Substance Use Topics  . Alcohol use: No  . Drug use: No     Allergies   Patient has no known allergies.   Review of Systems Review of Systems  Constitutional: Negative for fever.  Gastrointestinal: Positive for abdominal pain. Negative for vomiting.  Genitourinary: Negative for dysuria, hematuria, vaginal bleeding and vaginal discharge.  Musculoskeletal: Positive for back pain.  All other systems reviewed and are negative.    Physical Exam Updated Vital Signs BP (!) 101/52   Pulse 80   Temp 97.9 F (36.6 C) (Oral)   Resp 16   Ht 5\' 3"  (1.6 m)   Wt 56.2 kg   SpO2 98%   Breastfeeding? No   BMI 21.97 kg/m   Physical Exam  Constitutional: She is oriented to person,  place, and time. She appears well-developed and well-nourished.  Non-toxic appearance. She does not appear ill. No distress.  HENT:  Head: Normocephalic and atraumatic.  Right Ear: External ear normal.  Left Ear: External ear normal.  Nose: Nose normal.  Eyes: Right eye exhibits no discharge. Left eye exhibits no discharge.  Cardiovascular: Normal rate, regular rhythm and normal heart sounds.  Pulmonary/Chest: Effort normal and breath sounds normal.  Abdominal: Soft. There is tenderness in the right lower quadrant. There is CVA tenderness (right).  Genitourinary: Cervix exhibits motion tenderness. Vaginal discharge (mild) found.  Neurological: She is alert and oriented to person, place, and time.  Skin: Skin is warm and dry.  Nursing note and vitals reviewed.    ED Treatments / Results  Labs (all labs ordered are listed, but only abnormal results are displayed) Labs Reviewed  WET PREP, GENITAL - Abnormal; Notable for the following components:      Result Value   WBC, Wet Prep HPF POC MANY (*)    All other components within normal limits  URINALYSIS, ROUTINE W REFLEX MICROSCOPIC - Abnormal; Notable for the following components:   APPearance CLOUDY (*)    Hgb urine dipstick LARGE (*)    Protein, ur 100 (*)    Leukocytes, UA SMALL (*)    All other components within normal limits  URINALYSIS, MICROSCOPIC (REFLEX) - Abnormal; Notable for the following components:   Bacteria, UA MANY (*)    All other components within normal limits  PREGNANCY, URINE  GC/CHLAMYDIA PROBE AMP () NOT AT [facility]    EKG None  Radiology US Transvaginal Non-ob  Result Date: 07/08/2018 CLINICAL DATA:  RIGHT pelvic and back pain for 19 hours. EXAM: TRANSABDOMINAL AND TRANSVAGINAL ULTRASOUND OF PELVIS DOPPLER ULTRASOUND OF OVARIES TECHNIQUE: Both transabdominal and transvaginal ultrasound examinations of the pelvis were performed. Transabdominal technique was performed for global imaging of the  pelvis including uterus, ovaries, adnexal regions, and pelvic cul-de-sac. It was necessary to proceed with endovaginal exam following the transabdominal exam to visualize the adnexa and endometrium. Color and duplex Doppler ultrasound was utilized to evaluate blood flow to the ovaries. COMPARISON:  CT abdomen and pelvis June 24, 2018 FINDINGS: Uterus Measurements: 8.6 x 4.9 x 4.8 cm. No fibroids or other mass visualized. Mildly lobulated uterine contour without discrete mass. Endometrium Thickness: 6 mm.  No focal abnormality visualized. Right ovary Measurements: 3.1 x 2.2 x 2.2 cm. Normal appearance/no adnexal mass. Left ovary  Measurements: 4.4 x 3.4 x 3 cm. 3.7 cm homogeneously anechoic cyst without septations or solid components. Pulsed Doppler evaluation of both ovaries demonstrates normal low-resistance arterial and venous waveforms. Other findings No abnormal free fluid. IMPRESSION: 1. 3.7 cm benign-appearing cyst LEFT ovary. No routine indicated follow-up. This recommendation follows the consensus statement: Management of Asymptomatic Ovarian and Other Adnexal Cysts Imaged at US: Society of Radiologists in Ultrasound Consensus Conference Statement. Radiology 2010; [phone]. Electronically Signed   By: Awilda Metroourtnay  Bloomer M.D.   On: 07/08/2018 22:08   [place] Pelvis Complete  Result Date: 07/08/2018 CLINICAL DATA:  RIGHT pelvic and back pain for 19 hours. EXAM: TRANSABDOMINAL AND TRANSVAGINAL ULTRASOUND OF PELVIS DOPPLER ULTRASOUND OF OVARIES TECHNIQUE: Both transabdominal and transvaginal ultrasound examinations of the pelvis were performed. Transabdominal technique was performed for global imaging of the pelvis including uterus, ovaries, adnexal regions, and pelvic cul-de-sac. It was necessary to proceed with endovaginal exam following the transabdominal exam to visualize the adnexa and endometrium. Color and duplex Doppler ultrasound was utilized to evaluate blood flow to the ovaries. COMPARISON:  CT  abdomen and pelvis June 24, 2018 FINDINGS: Uterus Measurements: 8.6 x 4.9 x 4.8 cm. No fibroids or other mass visualized. Mildly lobulated uterine contour without discrete mass. Endometrium Thickness: 6 mm.  No focal abnormality visualized. Right ovary Measurements: 3.1 x 2.2 x 2.2 cm. Normal appearance/no adnexal mass. Left ovary Measurements: 4.4 x 3.4 x 3 cm. 3.7 cm homogeneously anechoic cyst without septations or solid components. Pulsed Doppler evaluation of both ovaries demonstrates normal low-resistance arterial and venous waveforms. Other findings No abnormal free fluid. IMPRESSION: 1. 3.7 cm benign-appearing cyst LEFT ovary. No routine indicated follow-up. This recommendation follows the consensus statement: Management of Asymptomatic Ovarian and Other Adnexal Cysts Imaged at US: Society of Radiologists in Ultrasound Consensus Conference Statement. Radiology 2010; [phone]. Electronically Signed   By: Awilda Metroourtnay  Bloomer M.D.   On: 07/08/2018 22:08   [place] Art/ven Flow Abd Pelv Doppler  Result Date: 07/08/2018 CLINICAL DATA:  RIGHT pelvic and back pain for 19 hours. EXAM: TRANSABDOMINAL AND TRANSVAGINAL ULTRASOUND OF PELVIS DOPPLER ULTRASOUND OF OVARIES TECHNIQUE: Both transabdominal and transvaginal ultrasound examinations of the pelvis were performed. Transabdominal technique was performed for global imaging of the pelvis including uterus, ovaries, adnexal regions, and pelvic cul-de-sac. It was necessary to proceed with endovaginal exam following the transabdominal exam to visualize the adnexa and endometrium. Color and duplex Doppler ultrasound was utilized to evaluate blood flow to the ovaries. COMPARISON:  CT abdomen and pelvis June 24, 2018 FINDINGS: Uterus Measurements: 8.6 x 4.9 x 4.8 cm. No fibroids or other mass visualized. Mildly lobulated uterine contour without discrete mass. Endometrium Thickness: 6 mm.  No focal abnormality visualized. Right ovary Measurements: 3.1 x 2.2 x 2.2 cm.  Normal appearance/no adnexal mass. Left ovary Measurements: 4.4 x 3.4 x 3 cm. 3.7 cm homogeneously anechoic cyst without septations or solid components. Pulsed Doppler evaluation of both ovaries demonstrates normal low-resistance arterial and venous waveforms. Other findings No abnormal free fluid. IMPRESSION: 1. 3.7 cm benign-appearing cyst LEFT ovary. No routine indicated follow-up. This recommendation follows the consensus statement: Management of Asymptomatic Ovarian and Other Adnexal Cysts Imaged at US: Society of Radiologists in Ultrasound Consensus Conference Statement. Radiology 2010; [phone]. Electronically Signed   By: Awilda Metroourtnay  Bloomer M.D.   On: 07/08/2018 22:08   Ct Renal Stone Study  Result Date: 07/08/2018 CLINICAL DATA:  Right flank and right lower quadrant pain EXAM: CT ABDOMEN AND PELVIS WITHOUT CONTRAST TECHNIQUE: Multidetector  CT imaging of the abdomen and pelvis was performed following the standard protocol without IV contrast. COMPARISON:  06/24/2018 FINDINGS: Lower chest: Lung bases demonstrate no acute consolidation or pleural effusion. The heart size is normal Hepatobiliary: No focal liver abnormality is seen. No gallstones, gallbladder wall thickening, or biliary dilatation. Pancreas: Unremarkable. No pancreatic ductal dilatation or surrounding inflammatory changes. Spleen: Normal in size without focal abnormality. Adrenals/Urinary Tract: Adrenal glands are within normal limits. Slightly enlarged right extrarenal pelvis with prominent right ureter and Peri ureteral stranding. Possible 1-2 mm stone in the distal right ureter near the UVJ. There are other calcified phleboliths in the pelvis. The bladder is not Stomach/Bowel: Stomach is within normal limits. Appendix appears normal. No evidence of bowel wall thickening, distention, or inflammatory changes. Vascular/Lymphatic: Nonaneurysmal aorta. No significantly enlarged lymph nodes Reproductive: Uterus is unremarkable.  3.4 cm left  ovarian cysts. Other: No free air or free fluid. Musculoskeletal: No acute or significant osseous findings. IMPRESSION: 1. Punctate 1-2 mm calcification along the posterior right aspect of the bladder is questionable for distal ureteral stone near the right UVJ. Slightly enlarged right extrarenal pelvis and prominence of the right ureter. 2. Negative for appendicitis 3. 3.4 cm left ovarian cyst Electronically Signed   By: Jasmine Pang M.D.   On: 07/08/2018 23:34    Procedures Procedures (including critical care time)  Medications Ordered in ED Medications  acetaminophen (TYLENOL) tablet 650 mg (650 mg Oral Given 07/08/18 2017)     Initial Impression / Assessment and Plan / ED Course  I have reviewed the triage vital signs and the nursing notes.  Pertinent labs & imaging results that were available during my care of the patient were reviewed by me and considered in my medical decision making (see chart for details).     Pelvic exam shows tenderness and minimal discharge but is otherwise unremarkable without any bleeding.  She is not pregnant.  Initial concern is for ovarian torsion so an emergent ultrasound was obtained and this shows no right-sided findings at all.  There is a left ovarian cyst but no right-sided cyst or torsion.  Thus, CT obtained to help rule out appendicitis versus stone.  There is a punctate stone that looks like is probably in the bladder or just prior to the bladder, which is likely causing her symptoms.  She will be treated with pain control and advised to follow-up with urology as needed if not passing the stone.  Otherwise her urine shows some bacteria in it but is a contaminated sample with squamous epithelial cells and I highly doubt acute UTI.  She has no signs or symptoms of STI.  Discharged home with return precautions.  Final Clinical Impressions(s) / ED Diagnoses   Final diagnoses:  Right ureteral stone    ED Discharge Orders         Ordered     HYDROcodone-acetaminophen (NORCO) 5-325 MG tablet  Every 4 hours PRN     07/08/18 2346           Pricilla Loveless, MD 07/08/18 2352

## 2018-07-08 NOTE — ED Notes (Signed)
Patient transported to Ultrasound 

## 2018-07-08 NOTE — ED Triage Notes (Signed)
C/o RLQ pain x this am that later radiates to right flank-NAD-steady gait

## 2018-07-09 LAB — GC/CHLAMYDIA PROBE AMP (~~LOC~~) NOT AT ARMC
CHLAMYDIA, DNA PROBE: NEGATIVE
Neisseria Gonorrhea: NEGATIVE

## 2021-08-09 ENCOUNTER — Encounter (HOSPITAL_BASED_OUTPATIENT_CLINIC_OR_DEPARTMENT_OTHER): Payer: Self-pay | Admitting: *Deleted

## 2021-08-09 ENCOUNTER — Other Ambulatory Visit: Payer: Self-pay

## 2021-08-09 ENCOUNTER — Emergency Department (HOSPITAL_BASED_OUTPATIENT_CLINIC_OR_DEPARTMENT_OTHER): Payer: BC Managed Care – PPO

## 2021-08-09 ENCOUNTER — Emergency Department (HOSPITAL_BASED_OUTPATIENT_CLINIC_OR_DEPARTMENT_OTHER)
Admission: EM | Admit: 2021-08-09 | Discharge: 2021-08-09 | Disposition: A | Discharge status: Home / Self Care | Payer: BC Managed Care – PPO | Attending: Emergency Medicine | Admitting: Emergency Medicine

## 2021-08-09 DIAGNOSIS — R001 Bradycardia, unspecified: Secondary | ICD-10-CM | POA: Insufficient documentation

## 2021-08-09 DIAGNOSIS — O1003 Pre-existing essential hypertension complicating the puerperium: Secondary | ICD-10-CM | POA: Insufficient documentation

## 2021-08-09 DIAGNOSIS — I10 Essential (primary) hypertension: Secondary | ICD-10-CM

## 2021-08-09 DIAGNOSIS — R079 Chest pain, unspecified: Secondary | ICD-10-CM

## 2021-08-09 LAB — COMPREHENSIVE METABOLIC PANEL
ALT: 60 U/L — ABNORMAL HIGH (ref 0–44)
AST: 46 U/L — ABNORMAL HIGH (ref 15–41)
Albumin: 2.8 g/dL — ABNORMAL LOW (ref 3.5–5.0)
Alkaline Phosphatase: 169 U/L — ABNORMAL HIGH (ref 38–126)
Anion gap: 7 (ref 5–15)
BUN: 11 mg/dL (ref 6–20)
CO2: 20 mmol/L — ABNORMAL LOW (ref 22–32)
Calcium: 9.1 mg/dL (ref 8.9–10.3)
Chloride: 111 mmol/L (ref 98–111)
Creatinine, Ser: 0.54 mg/dL (ref 0.44–1.00)
GFR, Estimated: >60 mL/min (ref >60)
Glucose, Bld: 94 mg/dL (ref 70–99)
Potassium: 4 mmol/L (ref 3.5–5.1)
Sodium: 138 mmol/L (ref 135–145)
Total Bilirubin: 0.2 mg/dL — ABNORMAL LOW (ref 0.3–1.2)
Total Protein: 5.7 g/dL — ABNORMAL LOW (ref 6.5–8.1)

## 2021-08-09 LAB — CBC WITH DIFFERENTIAL/PLATELET
Abs Immature Granulocytes: 0.02 10*3/uL (ref 0.00–0.07)
Basophils Absolute: 0 10*3/uL (ref 0.0–0.1)
Basophils Relative: 0 %
Eosinophils Absolute: 0.3 10*3/uL (ref 0.0–0.5)
Eosinophils Relative: 6 %
HCT: 28.6 % — ABNORMAL LOW (ref 36.0–46.0)
Hemoglobin: 9.6 g/dL — ABNORMAL LOW (ref 12.0–15.0)
Immature Granulocytes: 0 %
Lymphocytes Relative: 36 %
Lymphs Abs: 2 10*3/uL (ref 0.7–4.0)
MCH: 30.6 pg (ref 26.0–34.0)
MCHC: 33.6 g/dL (ref 30.0–36.0)
MCV: 91.1 fL (ref 80.0–100.0)
Monocytes Absolute: 0.5 10*3/uL (ref 0.1–1.0)
Monocytes Relative: 9 %
Neutro Abs: 2.7 10*3/uL (ref 1.7–7.7)
Neutrophils Relative %: 49 %
Platelets: 189 10*3/uL (ref 150–400)
RBC: 3.14 MIL/uL — ABNORMAL LOW (ref 3.87–5.11)
RDW: 14.7 % (ref 11.5–15.5)
WBC: 5.5 10*3/uL (ref 4.0–10.5)
nRBC: 0 % (ref 0.0–0.2)

## 2021-08-09 LAB — URINALYSIS, ROUTINE W REFLEX MICROSCOPIC
Bilirubin Urine: NEGATIVE
Glucose, UA: NEGATIVE mg/dL
Ketones, ur: NEGATIVE mg/dL
Nitrite: NEGATIVE
Protein, ur: 30 mg/dL — AB
Specific Gravity, Urine: 1.03 (ref 1.005–1.030)
pH: 7 (ref 5.0–8.0)

## 2021-08-09 LAB — URINALYSIS, MICROSCOPIC (REFLEX): RBC / HPF: >50 RBC/hpf (ref 0–5)

## 2021-08-09 LAB — URIC ACID: Uric Acid, Serum: 5.2 mg/dL (ref 2.5–7.1)

## 2021-08-09 LAB — TROPONIN I (HIGH SENSITIVITY): Troponin I (High Sensitivity): 3 ng/L (ref <18)

## 2021-08-09 NOTE — Discharge Instructions (Addendum)
Call the [facility] office about short-term follow-up.  Watch for headaches and blurred vision

## 2021-08-09 NOTE — ED Triage Notes (Signed)
Child birth on Sunday,  has had heaviness in chest since Tuesday,  states can hear heart in ears, chest soreness when takes deep breath

## 2021-08-09 NOTE — ED Provider Notes (Signed)
[facility] EMERGENCY DEPARTMENT Provider Note   CSN: [id number] Arrival date & time: 08/09/21  1648     History Chief Complaint  Patient presents with   Chest Pain    Jamie Wright is a 35 y.o. female.   Chest Pain Associated symptoms: shortness of breath   Associated symptoms: no abdominal pain, no cough and no weakness   Patient presents with chest pain.  Had for the last couple days.  Some shortness of breath.  She is 4 days postpartum.  Delivered at 36 weeks vaginally.  Uncomplicated.  She did not have any hypertension.  States her chest feels tight.  Short of breath at times.  No swelling her legs.  States she does have a history anxiety and thinks that may be contributing some.  States she is worried because her blood pressure goes high.  States it is high here normally her blood pressure will be about 90 systolic.    Past Medical History:  Diagnosis Date   Ectopic pregnancy    Irregular heartbeat 2009   Hx of Echo done   Miscarriage    Preterm labor 2008   Late 2nd or Early 3rd trimester PTL, injection given to stop contractions, was d/c home w/ meds   Preterm labor 2009   Preterm birth @ 32 wks; Abrupted placenta   Spinal headache    ? 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   Preterm delivery 08/19/2012   Preterm labor second trimester with preterm delivery third trimester 07/29/2012   Pregnant state, incidental 07/27/2012   Prior pregnancy with placenta abruption, antepartum 07/27/2012   History of ectopic pregnancy 07/27/2012    Past Surgical History:  Procedure Laterality Date   NO PAST SURGERIES       OB History     Gravida  6   Para  3   Term  2   Preterm  1   AB  2   Living  3      SAB  1   IAB  0   Ectopic  1   Multiple  0   Live Births  3           Family History   Problem Relation Age of Onset   Other Mother        Charcot Byrd Hesselbach tooth   Other Maternal Grandfather        Charcot Maria tooth   Cancer Maternal Aunt        Breast;before menopause   Cerebral palsy Sister        Twins;1/2 sisters   Hearing loss Neg Hx     Social History   Tobacco Use   Smoking status: Never   Smokeless tobacco: Never  Substance Use Topics   Alcohol use: No   Drug use: No    Home Medications Prior to Admission medications   Medication Sig Start Date End Date Taking? Authorizing Provider  HYDROcodone-acetaminophen (NORCO) 5-325 MG tablet Take 1 tablet by mouth every 4 (four) hours as needed for severe pain. 07/08/18   Pricilla Loveless, MD  Prenatal Vit-Fe Fumarate-FA (PRENATAL MULTIVITAMIN) TABS tablet Take 1 tablet by mouth daily.     [provider]  promethazine (PHENERGAN) 12.5 MG tablet Take 1 tablet (12.5 mg total) by mouth every 6 (six) hours as needed for nausea or vomiting. Patient  not taking: No sig reported 06/19/16   Judeth Horn, NP    Allergies    Patient has no known allergies.  Review of Systems   Review of Systems  Constitutional:  Negative for appetite change.  HENT:  Negative for congestion.   Respiratory:  Positive for shortness of breath. Negative for cough.   Cardiovascular:  Positive for chest pain. Negative for leg swelling.  Gastrointestinal:  Negative for abdominal pain.  Genitourinary:  Positive for vaginal bleeding.  Skin:  Negative for rash.  Neurological:  Negative for weakness.  Psychiatric/Behavioral:  Negative for confusion.    Physical Exam Updated Vital Signs BP 120/87 (BP Location: Left Arm)   Pulse (!) 53   Temp 98 F (36.7 C)   Resp 13   Ht 5\' 2"  (1.575 m)   Wt 70.8 kg   SpO2 97%   BMI 28.53 kg/m   Physical Exam Vitals reviewed.  Cardiovascular:     Rate and Rhythm: Regular rhythm. Bradycardia present.  Pulmonary:     Effort: No tachypnea.     Breath sounds: No wheezing, rhonchi or rales.   Chest:     Chest wall: No tenderness.  Abdominal:     Tenderness: There is no abdominal tenderness.  Musculoskeletal:     Cervical back: Neck supple.     Right lower leg: No edema.     Left lower leg: No edema.  Skin:    General: Skin is warm.     Capillary Refill: Capillary refill takes less than 2 seconds.  Neurological:     Mental Status: She is alert and oriented to person, place, and time.    ED Results / Procedures / Treatments   Labs (all labs ordered are listed, but only abnormal results are displayed) Labs Reviewed  CBC WITH DIFFERENTIAL/PLATELET - Abnormal; Notable for the following components:      Result Value   RBC 3.14 (*)    Hemoglobin 9.6 (*)    HCT 28.6 (*)    All other components within normal limits  COMPREHENSIVE METABOLIC PANEL - Abnormal; Notable for the following components:   CO2 20 (*)    Total Protein 5.7 (*)    Albumin 2.8 (*)    AST 46 (*)    ALT 60 (*)    Alkaline Phosphatase 169 (*)    Total Bilirubin 0.2 (*)    All other components within normal limits  URINALYSIS, ROUTINE W REFLEX MICROSCOPIC - Abnormal; Notable for the following components:   Hgb urine dipstick LARGE (*)    Protein, ur 30 (*)    Leukocytes,Ua TRACE (*)    All other components within normal limits  URINALYSIS, MICROSCOPIC (REFLEX) - Abnormal; Notable for the following components:   Bacteria, UA RARE (*)    All other components within normal limits  URIC ACID  LACTATE DEHYDROGENASE  TROPONIN I (HIGH SENSITIVITY)  TROPONIN I (HIGH SENSITIVITY)    EKG EKG Interpretation  Date/Time:  Thursday August 09 2021 16:58:38 EDT Ventricular Rate:  48 PR Interval:  119 QRS Duration: 85 QT Interval:  423 QTC Calculation: 378 R Axis:   60 Text Interpretation: Sinus bradycardia Borderline short PR interval Low voltage, precordial leads Confirmed by [id number] [phone]) on 08/09/2021 6:02:21 PM  Radiology DG Chest Portable 1 View  Result Date: 08/09/2021 CLINICAL  DATA:  Chest pain EXAM: PORTABLE CHEST 1 VIEW COMPARISON:  None. FINDINGS: Borderline cardiomegaly. No focal opacity or pleural effusion. No pneumothorax. IMPRESSION: No active disease.  Borderline  cardiomegaly Electronically Signed   By: Jasmine Pang M.D.   On: 08/09/2021 18:08    Procedures Procedures   Medications Ordered in ED Medications - No data to display  ED Course  I have reviewed the triage vital signs and the nursing notes.  Pertinent labs & imaging results that were available during my care of the patient were reviewed by me and considered in my medical decision making (see chart for details).    MDM Rules/Calculators/A&P                          Patient is 4 days postpartum.  Some chest pain and slight shortness of breath.  Not hypoxic.  Not tachycardic.  States she does have a history of anxiety.  However blood pressure is elevated.  Went up to 140/90, states she normally has pressures that are much lower.  LFTs just slightly above normal.  A little bit of protein in the urine also.  Discussed with the provider on-call for patient's OB/GYN.  We do not have definite criteria for preeclampsia, but with elevated blood pressure and mild other findings will have follow-up.  Will call tomorrow for appointment.  Appears stable for discharge.  Doubt pulmonary embolism.  Doubt amniotic embolism.  Final Clinical Impression(s) / ED Diagnoses Final diagnoses:  Nonspecific chest pain  Hypertension, unspecified type    Rx / DC Orders ED Discharge Orders     None        Benjiman Core, MD 08/10/21 0015

## 2021-08-10 LAB — LACTATE DEHYDROGENASE: LDH: 251 U/L — ABNORMAL HIGH (ref 98–192)

## 2024-10-20 ENCOUNTER — Other Ambulatory Visit (HOSPITAL_COMMUNITY): Payer: Self-pay
